# Patient Record
Sex: Male | Born: 1985 | Race: Black or African American | Hispanic: No | Marital: Single | Smoking: Never smoker
Health system: Southern US, Community
[De-identification: ages and names within clinical notes are randomized; demographics above are authoritative.]

## PROBLEM LIST (undated history)

## (undated) DIAGNOSIS — I5021 Acute systolic (congestive) heart failure: Secondary | ICD-10-CM

## (undated) DIAGNOSIS — I2510 Atherosclerotic heart disease of native coronary artery without angina pectoris: Secondary | ICD-10-CM

## (undated) HISTORY — DX: Atherosclerotic heart disease of native coronary artery without angina pectoris: I25.10

## (undated) HISTORY — DX: Acute systolic (congestive) heart failure: I50.21

---

## 2020-11-07 ENCOUNTER — Inpatient Hospital Stay (HOSPITAL_COMMUNITY)

## 2020-11-07 ENCOUNTER — Emergency Department (HOSPITAL_COMMUNITY)

## 2020-11-07 ENCOUNTER — Inpatient Hospital Stay (HOSPITAL_COMMUNITY)
Admission: EM | Admit: 2020-11-07 | Discharge: 2020-11-11 | DRG: 917 | Disposition: A | Discharge status: Home / Self Care | Attending: Internal Medicine | Admitting: Internal Medicine

## 2020-11-07 ENCOUNTER — Other Ambulatory Visit: Payer: Self-pay

## 2020-11-07 DIAGNOSIS — E785 Hyperlipidemia, unspecified: Secondary | ICD-10-CM | POA: Diagnosis present

## 2020-11-07 DIAGNOSIS — E876 Hypokalemia: Secondary | ICD-10-CM | POA: Diagnosis not present

## 2020-11-07 DIAGNOSIS — Z20822 Contact with and (suspected) exposure to covid-19: Secondary | ICD-10-CM | POA: Diagnosis present

## 2020-11-07 DIAGNOSIS — F1210 Cannabis abuse, uncomplicated: Secondary | ICD-10-CM | POA: Diagnosis present

## 2020-11-07 DIAGNOSIS — I5021 Acute systolic (congestive) heart failure: Secondary | ICD-10-CM | POA: Diagnosis present

## 2020-11-07 DIAGNOSIS — Z6841 Body Mass Index (BMI) 40.0 and over, adult: Secondary | ICD-10-CM

## 2020-11-07 DIAGNOSIS — F1590 Other stimulant use, unspecified, uncomplicated: Secondary | ICD-10-CM | POA: Diagnosis present

## 2020-11-07 DIAGNOSIS — I110 Hypertensive heart disease with heart failure: Secondary | ICD-10-CM | POA: Diagnosis present

## 2020-11-07 DIAGNOSIS — R7401 Elevation of levels of liver transaminase levels: Secondary | ICD-10-CM | POA: Diagnosis present

## 2020-11-07 DIAGNOSIS — R4182 Altered mental status, unspecified: Secondary | ICD-10-CM | POA: Diagnosis not present

## 2020-11-07 DIAGNOSIS — D649 Anemia, unspecified: Secondary | ICD-10-CM | POA: Diagnosis present

## 2020-11-07 DIAGNOSIS — I2510 Atherosclerotic heart disease of native coronary artery without angina pectoris: Secondary | ICD-10-CM | POA: Diagnosis present

## 2020-11-07 DIAGNOSIS — N179 Acute kidney failure, unspecified: Secondary | ICD-10-CM | POA: Diagnosis present

## 2020-11-07 DIAGNOSIS — G9340 Encephalopathy, unspecified: Secondary | ICD-10-CM

## 2020-11-07 DIAGNOSIS — W19XXXA Unspecified fall, initial encounter: Secondary | ICD-10-CM | POA: Diagnosis present

## 2020-11-07 DIAGNOSIS — E872 Acidosis: Secondary | ICD-10-CM | POA: Diagnosis present

## 2020-11-07 DIAGNOSIS — K7200 Acute and subacute hepatic failure without coma: Secondary | ICD-10-CM | POA: Diagnosis present

## 2020-11-07 DIAGNOSIS — I319 Disease of pericardium, unspecified: Secondary | ICD-10-CM | POA: Diagnosis not present

## 2020-11-07 DIAGNOSIS — I2102 ST elevation (STEMI) myocardial infarction involving left anterior descending coronary artery: Secondary | ICD-10-CM | POA: Diagnosis present

## 2020-11-07 DIAGNOSIS — Z888 Allergy status to other drugs, medicaments and biological substances status: Secondary | ICD-10-CM

## 2020-11-07 DIAGNOSIS — T43621A Poisoning by amphetamines, accidental (unintentional), initial encounter: Principal | ICD-10-CM | POA: Diagnosis present

## 2020-11-07 DIAGNOSIS — F1910 Other psychoactive substance abuse, uncomplicated: Secondary | ICD-10-CM | POA: Diagnosis present

## 2020-11-07 DIAGNOSIS — G928 Other toxic encephalopathy: Secondary | ICD-10-CM | POA: Diagnosis present

## 2020-11-07 DIAGNOSIS — Y929 Unspecified place or not applicable: Secondary | ICD-10-CM

## 2020-11-07 DIAGNOSIS — R9431 Abnormal electrocardiogram [ECG] [EKG]: Secondary | ICD-10-CM

## 2020-11-07 LAB — COMPREHENSIVE METABOLIC PANEL
ALT: 79 U/L — ABNORMAL HIGH (ref 0–44)
AST: 136 U/L — ABNORMAL HIGH (ref 15–41)
Albumin: 5 g/dL (ref 3.5–5.0)
Alkaline Phosphatase: 62 U/L (ref 38–126)
Anion gap: 17 — ABNORMAL HIGH (ref 5–15)
BUN: 32 mg/dL — ABNORMAL HIGH (ref 6–20)
CO2: 21 mmol/L — ABNORMAL LOW (ref 22–32)
Calcium: 10.6 mg/dL — ABNORMAL HIGH (ref 8.9–10.3)
Chloride: 101 mmol/L (ref 98–111)
Creatinine, Ser: 3.08 mg/dL — ABNORMAL HIGH (ref 0.61–1.24)
GFR, Estimated: 27 mL/min — ABNORMAL LOW (ref >60)
Glucose, Bld: 125 mg/dL — ABNORMAL HIGH (ref 70–99)
Potassium: 4.3 mmol/L (ref 3.5–5.1)
Sodium: 139 mmol/L (ref 135–145)
Total Bilirubin: 1.4 mg/dL — ABNORMAL HIGH (ref 0.3–1.2)
Total Protein: 10.3 g/dL — ABNORMAL HIGH (ref 6.5–8.1)

## 2020-11-07 LAB — I-STAT VENOUS BLOOD GAS, ED
Acid-base deficit: 5 mmol/L — ABNORMAL HIGH (ref 0.0–2.0)
Bicarbonate: 20.3 mmol/L (ref 20.0–28.0)
Calcium, Ion: 1.21 mmol/L (ref 1.15–1.40)
HCT: 45 % (ref 39.0–52.0)
Hemoglobin: 15.3 g/dL (ref 13.0–17.0)
O2 Saturation: 94 %
Potassium: 3.6 mmol/L (ref 3.5–5.1)
Sodium: 144 mmol/L (ref 135–145)
TCO2: 21 mmol/L — ABNORMAL LOW (ref 22–32)
pCO2, Ven: 39.1 mmHg — ABNORMAL LOW (ref 44.0–60.0)
pH, Ven: 7.324 (ref 7.250–7.430)
pO2, Ven: 78 mmHg — ABNORMAL HIGH (ref 32.0–45.0)

## 2020-11-07 LAB — CBC WITH DIFFERENTIAL/PLATELET
Abs Immature Granulocytes: 0.05 10*3/uL (ref 0.00–0.07)
Basophils Absolute: 0.1 10*3/uL (ref 0.0–0.1)
Basophils Relative: 1 %
Eosinophils Absolute: 0 10*3/uL (ref 0.0–0.5)
Eosinophils Relative: 0 %
HCT: 52.4 % — ABNORMAL HIGH (ref 39.0–52.0)
Hemoglobin: 17.2 g/dL — ABNORMAL HIGH (ref 13.0–17.0)
Immature Granulocytes: 1 %
Lymphocytes Relative: 19 %
Lymphs Abs: 2 10*3/uL (ref 0.7–4.0)
MCH: 27.2 pg (ref 26.0–34.0)
MCHC: 32.8 g/dL (ref 30.0–36.0)
MCV: 82.8 fL (ref 80.0–100.0)
Monocytes Absolute: 0.8 10*3/uL (ref 0.1–1.0)
Monocytes Relative: 8 %
Neutro Abs: 7.4 10*3/uL (ref 1.7–7.7)
Neutrophils Relative %: 71 %
Platelets: 362 10*3/uL (ref 150–400)
RBC: 6.33 MIL/uL — ABNORMAL HIGH (ref 4.22–5.81)
RDW: 13.6 % (ref 11.5–15.5)
WBC: 10.3 10*3/uL (ref 4.0–10.5)
nRBC: 0 % (ref 0.0–0.2)

## 2020-11-07 LAB — RAPID URINE DRUG SCREEN, HOSP PERFORMED
Amphetamines: POSITIVE — AB
Barbiturates: NOT DETECTED
Benzodiazepines: NOT DETECTED
Cocaine: NOT DETECTED
Opiates: POSITIVE — AB
Tetrahydrocannabinol: POSITIVE — AB

## 2020-11-07 LAB — TROPONIN I (HIGH SENSITIVITY)
Troponin I (High Sensitivity): 23 ng/L — ABNORMAL HIGH (ref <18)
Troponin I (High Sensitivity): 377 ng/L (ref <18)

## 2020-11-07 LAB — GLUCOSE, CAPILLARY: Glucose-Capillary: 140 mg/dL — ABNORMAL HIGH (ref 70–99)

## 2020-11-07 LAB — MRSA NEXT GEN BY PCR, NASAL: MRSA by PCR Next Gen: NOT DETECTED

## 2020-11-07 LAB — ACETAMINOPHEN LEVEL: Acetaminophen (Tylenol), Serum: <10 ug/mL — ABNORMAL LOW (ref 10–30)

## 2020-11-07 LAB — LACTIC ACID, PLASMA: Lactic Acid, Venous: 3.6 mmol/L (ref 0.5–1.9)

## 2020-11-07 LAB — RESP PANEL BY RT-PCR (FLU A&B, COVID) ARPGX2
Influenza A by PCR: NEGATIVE
Influenza B by PCR: NEGATIVE
SARS Coronavirus 2 by RT PCR: NEGATIVE

## 2020-11-07 LAB — SARS CORONAVIRUS 2 (TAT 6-24 HRS): SARS Coronavirus 2: NEGATIVE

## 2020-11-07 LAB — AMMONIA: Ammonia: 27 umol/L (ref 9–35)

## 2020-11-07 LAB — ETHANOL: Alcohol, Ethyl (B): <10 mg/dL (ref <10)

## 2020-11-07 LAB — SALICYLATE LEVEL: Salicylate Lvl: <7 mg/dL — ABNORMAL LOW (ref 7.0–30.0)

## 2020-11-07 MED ORDER — CHLORHEXIDINE GLUCONATE CLOTH 2 % EX PADS
6.0000 | MEDICATED_PAD | Freq: Every day | CUTANEOUS | Status: DC
  Administered 2020-11-07 – 2020-11-11 (×4): 6 via TOPICAL

## 2020-11-07 MED ORDER — DEXMEDETOMIDINE HCL IN NACL 400 MCG/100ML IV SOLN
0.4000 ug/kg/h | INTRAVENOUS | Status: DC
  Administered 2020-11-07: 0.4 ug/kg/h via INTRAVENOUS
  Filled 2020-11-07: qty 100

## 2020-11-07 MED ORDER — HEPARIN (PORCINE) IN NACL 1000-0.9 UT/500ML-% IV SOLN
INTRAVENOUS | Status: AC
  Filled 2020-11-07: qty 1000

## 2020-11-07 MED ORDER — LORAZEPAM 2 MG/ML IJ SOLN: 0.5000 mg | INTRAMUSCULAR | Status: DC | PRN

## 2020-11-07 MED ORDER — ASPIRIN 300 MG RE SUPP
300.0000 mg | Freq: Once | RECTAL | Status: AC
  Administered 2020-11-07: 300 mg via RECTAL
  Filled 2020-11-07: qty 1

## 2020-11-07 MED ORDER — FOLIC ACID 5 MG/ML IJ SOLN
1.0000 mg | Freq: Every day | INTRAMUSCULAR | Status: DC
  Administered 2020-11-07 – 2020-11-08 (×2): 1 mg via INTRAVENOUS
  Filled 2020-11-07 (×2): qty 0.2

## 2020-11-07 MED ORDER — MORPHINE SULFATE (PF) 4 MG/ML IV SOLN: 4.0000 mg | Freq: Once | INTRAVENOUS | Status: AC

## 2020-11-07 MED ORDER — LORAZEPAM 2 MG/ML IJ SOLN
1.0000 mg | Freq: Once | INTRAMUSCULAR | Status: AC
  Administered 2020-11-07: 1 mg via INTRAVENOUS
  Filled 2020-11-07: qty 1

## 2020-11-07 MED ORDER — LACTATED RINGERS IV SOLN: INTRAVENOUS | Status: DC

## 2020-11-07 MED ORDER — DEXMEDETOMIDINE HCL IN NACL 400 MCG/100ML IV SOLN
0.4000 ug/kg/h | INTRAVENOUS | Status: DC
  Administered 2020-11-07 (×2): 0.6 ug/kg/h via INTRAVENOUS
  Administered 2020-11-08: 0.4 ug/kg/h via INTRAVENOUS
  Administered 2020-11-08: 0.6 ug/kg/h via INTRAVENOUS
  Filled 2020-11-07 (×5): qty 100

## 2020-11-07 MED ORDER — PANTOPRAZOLE SODIUM 40 MG IV SOLR
40.0000 mg | Freq: Every day | INTRAVENOUS | Status: DC
  Administered 2020-11-07: 40 mg via INTRAVENOUS
  Filled 2020-11-07: qty 40

## 2020-11-07 MED ORDER — LACTATED RINGERS IV BOLUS
1000.0000 mL | Freq: Once | INTRAVENOUS | Status: AC
  Administered 2020-11-07: 1000 mL via INTRAVENOUS

## 2020-11-07 MED ORDER — DOCUSATE SODIUM 100 MG PO CAPS: 100.0000 mg | ORAL_CAPSULE | Freq: Two times a day (BID) | ORAL | Status: DC | PRN

## 2020-11-07 MED ORDER — POLYETHYLENE GLYCOL 3350 17 G PO PACK: 17.0000 g | PACK | Freq: Every day | ORAL | Status: DC | PRN

## 2020-11-07 MED ORDER — LIDOCAINE HCL (PF) 1 % IJ SOLN
INTRAMUSCULAR | Status: AC
  Filled 2020-11-07: qty 30

## 2020-11-07 MED ORDER — MORPHINE SULFATE (PF) 4 MG/ML IV SOLN
INTRAVENOUS | Status: AC
  Administered 2020-11-07: 4 mg via INTRAVENOUS
  Filled 2020-11-07: qty 1

## 2020-11-07 MED ORDER — THIAMINE HCL 100 MG/ML IJ SOLN
100.0000 mg | Freq: Every day | INTRAMUSCULAR | Status: DC
  Administered 2020-11-08 – 2020-11-09 (×2): 100 mg via INTRAVENOUS
  Filled 2020-11-07 (×2): qty 2

## 2020-11-07 NOTE — Progress Notes (Signed)
EEG Completed; Results Pending  

## 2020-11-07 NOTE — H&P (Signed)
NAME:  Ricky Berg, MRN:  [id number], DOB:  12/25/1987, LOS: 0 ADMISSION DATE:  11/07/2020, CONSULTATION DATE: 11/08/2018. REFERRING MD: Emergency department physician, CHIEF COMPLAINT: Altered mental status along with suspected overdose positive cardiac enzymes along with renal insufficiency and lactic  History of Present Illness:  35 year old male who is being brought into the local jail house and while being in process Eula Flax was seen doing just some chemicals.  His UDS was positive for amphetamines opiates and marijuana.  Planes of chest pain his troponin was 29 this was evaluated by cardiology most likely secondary to vasospasm no intervention is planned at this time.  Pulmonary critical care called to the bedside due to his agitation is positive cardiac enzymes lactic acidosis and renal insufficiency.  He will be admitted to the intensive care unit weanable placed on thiamine and folic acid along with Precedex and Ativan for agitation  Pertinent  Medical History  No past medical history on file.   Significant Hospital Events: Including procedures, antibiotic start and stop dates in addition to other pertinent events   11/08/2019 admit to ICU  Interim History / Subjective:  35 year old remains agitated  Objective   Blood pressure (!) 141/97, pulse 94, temperature 98.3 F (36.8 C), resp. rate (!) 24, SpO2 95 %.    FiO2 (%):  [28 %] 28 %   Intake/Output Summary (Last 24 hours) at 11/07/2020 1221 Last data filed at 11/07/2020 1220 Gross per 24 hour  Intake 2000 ml  Output --  Net 2000 ml   There were no vitals filed for this visit.  Examination: General: Morbidly obese male.  Seated sedated or agitated HENT: No JVD or lymphadenopathy is appreciated Lungs: Clear to auscultation Cardiovascular: Heart sounds regular regular rhythm Abdomen: Obese soft nontender Extremities: Warm and dry Neuro: Either agitated or sedated GU: Voids  Resolved Hospital Problem list      Assessment & Plan:  Altered mental status most likely due to ingestion of drugs while in jail.  UDS positive for tetrahydrocannabinol, opiates and amphetamines. Chemical restraint Physical restraint Consider adding Precedex As needed Ativan Monitoring intensive care unit If no improvement may require CT of the head  Elevated troponins 29 with EKG changes compatible with acute coronary syndrome but no interventions at this time is most likely secondary to vasospasm from amphetamine. Cardiology has evaluated Cardiac monitor. Serial troponin Repeat twelve-lead When Amphetamines have dissipated  Lactic acidosis Repeat lactic acid Trend lactic acid Fluid resuscitation  Acute kidney injury Lab Results  Component Value Date   CREATININE 3.08 (H) 11/07/2020   Trend creatinine Fluid resuscitation  Best Practice (right click and "Reselect all SmartList Selections" daily)   Diet/type: NPO DVT prophylaxis: not indicated GI prophylaxis: N/A Lines: N/A Foley:  N/A Code Status:  full code Last date of multidisciplinary goals of care discussion [tbd]  Labs   CBC: Recent Labs  Lab 11/07/20 0903 11/07/20 1128  WBC 10.3  --   NEUTROABS 7.4  --   HGB 17.2* 15.3  HCT 52.4* 45.0  MCV 82.8  --   PLT 362  --     Basic Metabolic Panel: Recent Labs  Lab 11/07/20 0903 11/07/20 1128  NA 139 144  K 4.3 3.6  CL 101  --   CO2 21*  --   GLUCOSE 125*  --   BUN 32*  --   CREATININE 3.08*  --   CALCIUM 10.6*  --    GFR: CrCl cannot be calculated (Unknown ideal weight.). Recent Labs  Lab 11/07/20 0903 11/07/20 0923  WBC 10.3  --   LATICACIDVEN  --  3.6*    Liver Function Tests: Recent Labs  Lab 11/07/20 0903  AST 136*  ALT 79*  ALKPHOS 62  BILITOT 1.4*  PROT 10.3*  ALBUMIN 5.0   No results for input(s): LIPASE, AMYLASE in the last 168 hours. Recent Labs  Lab 11/07/20 0924  AMMONIA 27    ABG    Component Value Date/Time   HCO3 20.3 11/07/2020 1128    TCO2 21 (L) 11/07/2020 1128   ACIDBASEDEF 5.0 (H) 11/07/2020 1128   O2SAT 94.0 11/07/2020 1128     Coagulation Profile: No results for input(s): INR, PROTIME in the last 168 hours.  Cardiac Enzymes: No results for input(s): CKTOTAL, CKMB, CKMBINDEX, TROPONINI in the last 168 hours.  HbA1C: No results found for: HGBA1C  CBG: No results for input(s): GLUCAP in the last 168 hours.  Review of Systems:   na  Past Medical History:  He,  has no past medical history on file.   Surgical History:     Social History:      Family History:  His family history is not on file.   Allergies Not on File   Home Medications  Prior to Admission medications   Not on File     Critical care time: 40 min    Brett Canales Fonnie Crookshanks ACNP Acute Care Nurse Practitioner [facility] Pulmonary/Critical Care Please consult Amion 11/07/2020, 12:21 PM

## 2020-11-07 NOTE — Progress Notes (Signed)
Vonzella Nipple called regarding bringing patients glasses from facility with oncoming deputy.

## 2020-11-07 NOTE — ED Triage Notes (Signed)
Patient brought in by [facility] from prison for drug overdose. Patient was lethargic, altered and possibly took stimulants. Patient in handcuffs, yelling and cussing at staff for water. "I will shoot you if you do not give me water." Patient restless and diaphoretic. C/o chest pain.

## 2020-11-07 NOTE — Progress Notes (Signed)
eLink Physician-Brief Progress Note Patient Name: Ricky Berg DOB: 12/25/1987 MRN: [id number]   Date of Service  11/07/2020  HPI/Events of Note  Patient is requesting a drink of water, he is alert, oriented and interactive.  eICU Interventions  Bedside swallow evaluation ordered, followed by a clear liquid diet if he passes.        Migdalia Dk 11/07/2020, 9:31 PM

## 2020-11-07 NOTE — ED Notes (Signed)
Attempted report 

## 2020-11-07 NOTE — ED Notes (Signed)
Spoke with charge RN on 55M regarding patient. Still waiting to give report.

## 2020-11-07 NOTE — Progress Notes (Signed)
Pt unavailable for EEG at this time as will be going to 36M; will do EEG later as schedule permits.

## 2020-11-07 NOTE — Progress Notes (Signed)
eLink Physician-Brief Progress Note Patient Name: Ricky Berg DOB: 12/25/1987 MRN: [id number]   Date of Service  11/07/2020  HPI/Events of Note  Patient is a difficult iv stick and has not had blood successfully drawn for admission labs.  eICU Interventions  Midline iv ordered,  17M charge nurse requested to attempt to obtain blood for labs.        Migdalia Dk 11/07/2020, 10:41 PM

## 2020-11-07 NOTE — ED Notes (Signed)
Attempted report x1. 

## 2020-11-07 NOTE — ED Provider Notes (Addendum)
[facility]ENC[facility] Provider Note   CSN: [id number] Arrival date & time: 11/07/20  0847     History No chief complaint on file.   Ricky Berg is a 35 y.o. male.  Patient is a 35 year old male with unknown medical history who is presenting today from jail initially as a code STEMI.  The story is provided by paramedics and the police officer and report that the patient took an unknown substance prior to be taking into police custody.  While he was in the holding cell of the jail he was extremely aggressive pounding on the door is beating on the windows and then suddenly became unresponsive, sweaty and ashen.  Police called paramedics and when they arrived patient was minimally responsive with significant diaphoresis and an EKG showed significant ST elevation in multiple leads as well as tachycardia and hypertension.  Patient refused aspirin and nitroglycerin in route and upon arrival to the emergency room paramedics do report that he is much more interactive at this time.  Patient refuses to answer most questions.  He repeatedly requests water.  When asked if he has chest pain he does say yes and touches his chest but does not give any further information.  He cannot tell me that we are in the hospital.  Unknown if he has any medical problems or takes any medications.  Unknown what he took prior to arrival.  Patient just continues to say that Gregary SignsSean wants to die outside and we are depriving him of his last wish.  The history is provided by the patient, the EMS personnel and the police. The history is limited by the condition of the patient.      No past medical history on file.  There are no problems to display for this patient.        No family history on file.     Home Medications Prior to Admission medications   Not on File    Allergies    Patient has no allergy information on record.  Review of Systems   Review of Systems  Unable to perform  ROS: Mental status change   Physical Exam Updated Vital Signs BP (!) 128/100 (BP Location: Right Arm)   Pulse (!) 136   Temp 98.3 F (36.8 C)   Resp (!) 24   SpO2 93%   Physical Exam Vitals and nursing note reviewed.  Constitutional:      General: He is not in acute distress.    Appearance: He is well-developed. He is obese. He is ill-appearing and diaphoretic.  HENT:     Head: Normocephalic and atraumatic.     Mouth/Throat:     Mouth: Mucous membranes are dry.  Eyes:     Extraocular Movements: Extraocular movements intact.     Conjunctiva/sclera: Conjunctivae normal.     Pupils: Pupils are equal, round, and reactive to light.  Cardiovascular:     Rate and Rhythm: Regular rhythm. Tachycardia present.     Pulses: Normal pulses.     Heart sounds: No murmur heard. Pulmonary:     Effort: Pulmonary effort is normal. No respiratory distress.     Breath sounds: Normal breath sounds. No wheezing or rales.  Abdominal:     General: There is no distension.     Palpations: Abdomen is soft.     Tenderness: There is no abdominal tenderness. There is no guarding or rebound.  Musculoskeletal:        General: No tenderness. Normal range of  motion.     Cervical back: Normal range of motion and neck supple.     Right lower leg: No edema.     Left lower leg: No edema.  Skin:    General: Skin is warm.     Coloration: Skin is pale.     Findings: No erythema or rash.  Neurological:     Mental Status: He is alert.     Comments: Oriented to place.  Noted to move all extremities.  Will not follow commands.  Psychiatric:        Attention and Perception: He is inattentive.        Mood and Affect: Affect is inappropriate.        Speech: He is noncommunicative.        Behavior: Behavior is agitated.     Comments: Appears to be speaking in third person reporting that Gregary Signs wants to die outside.    ED Results / Procedures / Treatments   Labs (all labs ordered are listed, but only abnormal  results are displayed) Labs Reviewed  CBC WITH DIFFERENTIAL/PLATELET - Abnormal; Notable for the following components:      Result Value   RBC 6.33 (*)    Hemoglobin 17.2 (*)    HCT 52.4 (*)    All other components within normal limits  COMPREHENSIVE METABOLIC PANEL - Abnormal; Notable for the following components:   Glucose, Bld 125 (*)    BUN 32 (*)    Creatinine, Ser 3.08 (*)    Calcium 10.6 (*)    Total Protein 10.3 (*)    AST 136 (*)    ALT 79 (*)    Total Bilirubin 1.4 (*)    GFR, Estimated 27 (*)    All other components within normal limits  LACTIC ACID, PLASMA - Abnormal; Notable for the following components:   Lactic Acid, Venous 3.6 (*)    All other components within normal limits  TROPONIN I (HIGH SENSITIVITY) - Abnormal; Notable for the following components:   Troponin I (High Sensitivity) 23 (*)    All other components within normal limits  RESP PANEL BY RT-PCR (FLU A&B, COVID) ARPGX2  AMMONIA  ETHANOL  RAPID URINE DRUG SCREEN, HOSP PERFORMED  ACETAMINOPHEN LEVEL  SALICYLATE LEVEL    EKG EKG Interpretation  Date/Time:  Tuesday November 07 2020 08:59:44 EDT Ventricular Rate:  144 PR Interval:  102 QRS Duration: 74 QT Interval:  288 QTC Calculation: 446 R Axis:   48 Text Interpretation: Sinus tachycardia Probable inferior infarct, acute Anterolateral infarct, acute (LAD) Baseline wander in lead(s) V1 Acute MI Confirmed by Gwyneth Sprout ([id number]) on 11/07/2020 9:43:31 AM  Radiology DG Chest Port 1 View  Result Date: 11/07/2020 CLINICAL DATA:  Chest pain and drug overdose EXAM: PORTABLE CHEST 1 VIEW COMPARISON:  None. FINDINGS: A small portion of the lateral left base is not visualized. Visualized lungs are clear. Heart is upper normal in size with pulmonary vascularity normal. No adenopathy. No pneumothorax. No bone lesions. IMPRESSION: No edema or airspace opacity. Note that a small portion of the lateral left base not visualized. Heart upper normal in size. No  adenopathy evident. Electronically Signed   By: Bretta Bang III M.D.   On: 11/07/2020 09:32    Procedures Procedures   Medications Ordered in ED Medications - No data to display  ED Course  I have reviewed the triage vital signs and the nursing notes.  Pertinent labs & imaging results that were available during my care of  the patient were reviewed by me and considered in my medical decision making (see chart for details).    MDM Rules/Calculators/A&P                          35 year old male with unknown medical history presenting from jail today initially as a code STEMI.  Patient took an unknown substance which is thought to be a stimulant because he is tachycardic and hypertensive but has an EKG with diffuse ST elevation in lateral anterior and some inferior leads.  Patient does indicate that he has chest pain but will not give any further history.  Dr. Eldridge Dace was present at the bridge when the patient arrived and due to his altered mental status, report of unknown substance ingestion and diffuse ST elevation he was concerned that this is more from diffuse coronary spasm from drugs and they chose not to take the patient to the Cath Lab.  Based on EMSs report he is more lucid now than he had been earlier.  Patient is just recurrently requesting water and may be speaking about himself in the third person reporting that he wants to die.  Patient is still diaphoretic and tachycardic.  Will give IV Ativan, IV fluids.  Labs are pending.  We will continue to monitor closely.  At this time he is cooperative.  10:19 AM Patient remained agitated, singing, tongue fasciculations and encephalopathic.  Continued to have tachycardia and moaning that his chest hurt.  Patient was given 4 mg of morphine and another additional 1 mg of Ativan.  Heart rate did improve after these medications and receiving 1 L of fluid to approximately 120.  Ammonia within normal limits, lactic acid is 3.6, CBC normal  except for hemoglobin of 17, CMP with a creatinine of 3, BUN of 32 and elevated LFTs AST of 136 and ALT of 79.  Troponin elevated at 23 COVID is negative.  Chest x-ray showing no evidence of edema or airspace opacity.  10:39 AM Patient is now more somnolent.  He does arouse with some stimulation but is still altered.  Heart rate now 114, oxygen had dropped to 90% and he was placed on 2 L.  Will check VBG.  Will discuss with ICU.  MDM   Amount and/or Complexity of Data Reviewed Clinical lab tests: ordered and reviewed Tests in the radiology section of CPT: ordered and reviewed Tests in the medicine section of CPT: ordered and reviewed Decide to obtain previous medical records or to obtain history from someone other than the patient: yes Obtain history from someone other than the patient: yes Review and summarize past medical records: yes Discuss the patient with other providers: yes Independent visualization of images, tracings, or specimens: yes  Patient Progress Patient progress: improved  CRITICAL CARE Performed by: Kelten Enochs Total critical care time: 60 minutes Critical care time was exclusive of separately billable procedures and treating other patients. Critical care was necessary to treat or prevent imminent or life-threatening deterioration. Critical care was time spent personally by me on the following activities: development of treatment plan with patient and/or surrogate as well as nursing, discussions with consultants, evaluation of patient's response to treatment, examination of patient, obtaining history from patient or surrogate, ordering and performing treatments and interventions, ordering and review of laboratory studies, ordering and review of radiographic studies, pulse oximetry and re-evaluation of patient's condition.  Final Clinical Impression(s) / ED Diagnoses Final diagnoses:  ST elevation  AKI (acute kidney  injury) (HCC)  Encephalopathy  Substance  abuse [facility])    Rx / DC Orders ED Discharge Orders     None        Gwyneth Sprout, MD 11/07/20 1112    Gwyneth Sprout, MD 11/07/20 1113

## 2020-11-07 NOTE — Significant Event (Signed)
    Called for STEMI.  ECG showed diffuse ST elevation.  In the ER, it was noted that the patient had taken an unknown stimulant and was confused.  He had been fighting with people at the jail this AM.  ECG findings likely related to illicit drug use/vasospasm and we will defer cath for now.    Corky Crafts, MD

## 2020-11-08 ENCOUNTER — Inpatient Hospital Stay: Payer: Self-pay

## 2020-11-08 ENCOUNTER — Inpatient Hospital Stay (HOSPITAL_COMMUNITY)

## 2020-11-08 ENCOUNTER — Encounter (HOSPITAL_COMMUNITY): Payer: Self-pay | Admitting: Internal Medicine

## 2020-11-08 DIAGNOSIS — I2109 ST elevation (STEMI) myocardial infarction involving other coronary artery of anterior wall: Secondary | ICD-10-CM

## 2020-11-08 DIAGNOSIS — R4182 Altered mental status, unspecified: Secondary | ICD-10-CM

## 2020-11-08 DIAGNOSIS — I2102 ST elevation (STEMI) myocardial infarction involving left anterior descending coronary artery: Secondary | ICD-10-CM

## 2020-11-08 LAB — COMPREHENSIVE METABOLIC PANEL
ALT: 61 U/L — ABNORMAL HIGH (ref 0–44)
ALT: 80 U/L — ABNORMAL HIGH (ref 0–44)
AST: 189 U/L — ABNORMAL HIGH (ref 15–41)
AST: 236 U/L — ABNORMAL HIGH (ref 15–41)
Albumin: 3.3 g/dL — ABNORMAL LOW (ref 3.5–5.0)
Albumin: 3.6 g/dL (ref 3.5–5.0)
Alkaline Phosphatase: 38 U/L (ref 38–126)
Alkaline Phosphatase: 50 U/L (ref 38–126)
Anion gap: 10 (ref 5–15)
Anion gap: 8 (ref 5–15)
BUN: 18 mg/dL (ref 6–20)
BUN: 29 mg/dL — ABNORMAL HIGH (ref 6–20)
CO2: 23 mmol/L (ref 22–32)
CO2: 24 mmol/L (ref 22–32)
Calcium: 8.4 mg/dL — ABNORMAL LOW (ref 8.9–10.3)
Calcium: 8.7 mg/dL — ABNORMAL LOW (ref 8.9–10.3)
Chloride: 107 mmol/L (ref 98–111)
Chloride: 99 mmol/L (ref 98–111)
Creatinine, Ser: 1.07 mg/dL (ref 0.61–1.24)
Creatinine, Ser: 1.57 mg/dL — ABNORMAL HIGH (ref 0.61–1.24)
GFR, Estimated: 60 mL/min — ABNORMAL LOW (ref >60)
GFR, Estimated: >60 mL/min (ref >60)
Glucose, Bld: 143 mg/dL — ABNORMAL HIGH (ref 70–99)
Glucose, Bld: 72 mg/dL (ref 70–99)
Potassium: 3.7 mmol/L (ref 3.5–5.1)
Potassium: 3.7 mmol/L (ref 3.5–5.1)
Sodium: 133 mmol/L — ABNORMAL LOW (ref 135–145)
Sodium: 138 mmol/L (ref 135–145)
Total Bilirubin: 0.6 mg/dL (ref 0.3–1.2)
Total Bilirubin: 0.9 mg/dL (ref 0.3–1.2)
Total Protein: 6.8 g/dL (ref 6.5–8.1)
Total Protein: 7.5 g/dL (ref 6.5–8.1)

## 2020-11-08 LAB — TROPONIN I (HIGH SENSITIVITY)
Troponin I (High Sensitivity): 13566 ng/L (ref <18)
Troponin I (High Sensitivity): 20555 ng/L (ref <18)
Troponin I (High Sensitivity): 22828 ng/L (ref <18)

## 2020-11-08 LAB — LACTIC ACID, PLASMA
Lactic Acid, Venous: 3 mmol/L (ref 0.5–1.9)
Lactic Acid, Venous: 4 mmol/L (ref 0.5–1.9)

## 2020-11-08 LAB — PHOSPHORUS
Phosphorus: 2.4 mg/dL — ABNORMAL LOW (ref 2.5–4.6)
Phosphorus: 3.5 mg/dL (ref 2.5–4.6)

## 2020-11-08 LAB — CBC
HCT: 27.6 % — ABNORMAL LOW (ref 39.0–52.0)
HCT: 41.3 % (ref 39.0–52.0)
Hemoglobin: 13.2 g/dL (ref 13.0–17.0)
Hemoglobin: 8.6 g/dL — ABNORMAL LOW (ref 13.0–17.0)
MCH: 26.7 pg (ref 26.0–34.0)
MCH: 27.1 pg (ref 26.0–34.0)
MCHC: 31.2 g/dL (ref 30.0–36.0)
MCHC: 32 g/dL (ref 30.0–36.0)
MCV: 83.4 fL (ref 80.0–100.0)
MCV: 87.1 fL (ref 80.0–100.0)
Platelets: 206 10*3/uL (ref 150–400)
Platelets: 333 10*3/uL (ref 150–400)
RBC: 3.17 MIL/uL — ABNORMAL LOW (ref 4.22–5.81)
RBC: 4.95 MIL/uL (ref 4.22–5.81)
RDW: 13.3 % (ref 11.5–15.5)
RDW: 13.9 % (ref 11.5–15.5)
WBC: 5.8 10*3/uL (ref 4.0–10.5)
WBC: 8.6 10*3/uL (ref 4.0–10.5)
nRBC: 0 % (ref 0.0–0.2)
nRBC: 0 % (ref 0.0–0.2)

## 2020-11-08 LAB — ECHOCARDIOGRAM COMPLETE
AR max vel: 3.81 cm2
AV Area VTI: 3.64 cm2
AV Area mean vel: 3.92 cm2
AV Mean grad: 2 mmHg
AV Peak grad: 3.3 mmHg
Ao pk vel: 0.91 m/s
Area-P 1/2: 4.46 cm2
Height: 76 in
S' Lateral: 3 cm
Weight: 5513.26 oz

## 2020-11-08 LAB — MAGNESIUM
Magnesium: 2 mg/dL (ref 1.7–2.4)
Magnesium: 2.2 mg/dL (ref 1.7–2.4)

## 2020-11-08 LAB — AMYLASE
Amylase: 22 U/L — ABNORMAL LOW (ref 28–100)
Amylase: 29 U/L (ref 28–100)

## 2020-11-08 LAB — LIPASE, BLOOD
Lipase: 25 U/L (ref 11–51)
Lipase: 34 U/L (ref 11–51)

## 2020-11-08 LAB — PROCALCITONIN
Procalcitonin: 0.16 ng/mL
Procalcitonin: 0.12 ng/mL

## 2020-11-08 LAB — HIV ANTIBODY (ROUTINE TESTING W REFLEX)
HIV Screen 4th Generation wRfx: NONREACTIVE
HIV Screen 4th Generation wRfx: NONREACTIVE

## 2020-11-08 LAB — CORTISOL
Cortisol, Plasma: 13.8 ug/dL
Cortisol, Plasma: 20.8 ug/dL

## 2020-11-08 LAB — APTT
aPTT: 24 seconds (ref 24–36)
aPTT: 40 seconds — ABNORMAL HIGH (ref 24–36)

## 2020-11-08 LAB — HEPARIN LEVEL (UNFRACTIONATED): Heparin Unfractionated: 0.25 IU/mL — ABNORMAL LOW (ref 0.30–0.70)

## 2020-11-08 LAB — PROTIME-INR
INR: 1.1 (ref 0.8–1.2)
INR: 1.2 (ref 0.8–1.2)
Prothrombin Time: 13.9 seconds (ref 11.4–15.2)
Prothrombin Time: 14.8 seconds (ref 11.4–15.2)

## 2020-11-08 MED ORDER — POTASSIUM CHLORIDE CRYS ER 20 MEQ PO TBCR
20.0000 meq | EXTENDED_RELEASE_TABLET | Freq: Once | ORAL | Status: AC
  Administered 2020-11-08: 20 meq via ORAL
  Filled 2020-11-08: qty 1

## 2020-11-08 MED ORDER — METOPROLOL TARTRATE 25 MG PO TABS
25.0000 mg | ORAL_TABLET | Freq: Two times a day (BID) | ORAL | Status: DC
  Administered 2020-11-08: 25 mg via ORAL
  Filled 2020-11-08: qty 1

## 2020-11-08 MED ORDER — HEPARIN (PORCINE) 25000 UT/250ML-% IV SOLN
2150.0000 [IU]/h | INTRAVENOUS | Status: DC
  Administered 2020-11-08 (×2): 1900 [IU]/h via INTRAVENOUS
  Administered 2020-11-09: 2150 [IU]/h via INTRAVENOUS
  Filled 2020-11-08 (×3): qty 250

## 2020-11-08 MED ORDER — PERFLUTREN LIPID MICROSPHERE
1.0000 mL | INTRAVENOUS | Status: AC | PRN
  Administered 2020-11-08: 5 mL via INTRAVENOUS
  Filled 2020-11-08: qty 10

## 2020-11-08 MED ORDER — NITROGLYCERIN 0.4 MG SL SUBL
0.4000 mg | SUBLINGUAL_TABLET | SUBLINGUAL | Status: DC | PRN
  Administered 2020-11-08 – 2020-11-11 (×7): 0.4 mg via SUBLINGUAL
  Filled 2020-11-08 (×4): qty 1

## 2020-11-08 MED ORDER — ASPIRIN 325 MG PO TABS
325.0000 mg | ORAL_TABLET | Freq: Every day | ORAL | Status: DC
  Administered 2020-11-08: 325 mg via ORAL
  Filled 2020-11-08 (×2): qty 1

## 2020-11-08 MED ORDER — CARVEDILOL 3.125 MG PO TABS
3.1250 mg | ORAL_TABLET | Freq: Two times a day (BID) | ORAL | Status: DC
  Administered 2020-11-09 – 2020-11-10 (×3): 3.125 mg via ORAL
  Filled 2020-11-08 (×4): qty 1

## 2020-11-08 MED ORDER — FOLIC ACID 1 MG PO TABS
1.0000 mg | ORAL_TABLET | Freq: Every day | ORAL | Status: DC
  Administered 2020-11-09 – 2020-11-11 (×3): 1 mg via ORAL
  Filled 2020-11-08 (×3): qty 1

## 2020-11-08 MED ORDER — ATORVASTATIN CALCIUM 80 MG PO TABS
80.0000 mg | ORAL_TABLET | Freq: Every day | ORAL | Status: DC
  Administered 2020-11-08 – 2020-11-11 (×4): 80 mg via ORAL
  Filled 2020-11-08 (×2): qty 2
  Filled 2020-11-08 (×2): qty 1
  Filled 2020-11-08: qty 2

## 2020-11-08 MED ORDER — MORPHINE SULFATE (PF) 2 MG/ML IV SOLN
2.0000 mg | INTRAVENOUS | Status: DC | PRN
  Administered 2020-11-08 – 2020-11-09 (×5): 2 mg via INTRAVENOUS
  Filled 2020-11-08 (×5): qty 1

## 2020-11-08 MED ORDER — HEPARIN BOLUS VIA INFUSION
5000.0000 [IU] | Freq: Once | INTRAVENOUS | Status: AC
  Administered 2020-11-08: 5000 [IU] via INTRAVENOUS
  Filled 2020-11-08: qty 5000

## 2020-11-08 MED ORDER — POTASSIUM PHOSPHATES 15 MMOLE/5ML IV SOLN
15.0000 mmol | Freq: Once | INTRAVENOUS | Status: AC
  Administered 2020-11-08: 15 mmol via INTRAVENOUS
  Filled 2020-11-08: qty 5

## 2020-11-08 MED ORDER — PANTOPRAZOLE SODIUM 40 MG PO TBEC
40.0000 mg | DELAYED_RELEASE_TABLET | Freq: Every day | ORAL | Status: DC
  Administered 2020-11-08: 40 mg via ORAL
  Filled 2020-11-08: qty 1

## 2020-11-08 MED ORDER — ASPIRIN EC 81 MG PO TBEC
81.0000 mg | DELAYED_RELEASE_TABLET | Freq: Every day | ORAL | Status: DC
  Administered 2020-11-09 – 2020-11-11 (×3): 81 mg via ORAL
  Filled 2020-11-08 (×3): qty 1

## 2020-11-08 NOTE — Progress Notes (Signed)
ANTICOAGULATION CONSULT NOTE  Pharmacy Consult fo Heparin Indication: chest pain/ACS  Allergies  Allergen Reactions   Haloperidol And Related Anaphylaxis    Face, tongue, and throat swelling    Patient Measurements: Height: 6\' 4"  (193 cm) Weight: (!) 156.3 kg (344 lb 9.3 oz) IBW/kg (Calculated) : 86.8 Heparin Dosing Weight: 120 kg  Vital Signs: Temp: 98.6 F (37 C) (06/29 1926) Temp Source: Oral (06/29 1926) BP: 118/54 (06/29 1800) Pulse Rate: 92 (06/29 1800)  Labs: Recent Labs    11/07/20 0903 11/07/20 1128 11/07/20 1144 11/07/20 2332 11/08/20 0536 11/08/20 1854  HGB 17.2* 15.3  --  8.6*  --   --   HCT 52.4* 45.0  --  27.6*  --   --   PLT 362  --   --  333  --   --   APTT  --   --   --  24  --   --   LABPROT  --   --   --  14.8  --   --   INR  --   --   --  1.2  --   --   HEPARINUNFRC  --   --   --   --   --  0.25*  CREATININE 3.08*  --   --  1.57*  --   --   TROPONINIHS 23*  --  377* 13,566* 20,555*  --      Estimated Creatinine Clearance: 109.5 mL/min (A) (by C-G formula based on SCr of 1.57 mg/dL (H)).   Assessment: 35 y.o. male admitted with chest pain and elevated troponin.  Pharmacy consulted to dose IV heparin.  Heparin level is slightly below goal.  No issue with heparin infusion nor bleeding per RN.  Goal of Therapy:  Heparin level 0.3-0.7 units/ml Monitor platelets by anticoagulation protocol: Yes   Plan:  Increase heparin gtt to 2150 units/hr F/u AM labs Awaiting PICC  Nox Talent D. [id number], PharmD, BCPS, BCCCP 11/08/2020, 7:42 PM

## 2020-11-08 NOTE — Progress Notes (Signed)
Pt c/o active chest pain radiating across chest.   Will provide nitro SL, already on heparin.   Add morphine if nitro ineffective.   Cards notified via RN although they have already seen and are planning for cath when able to get consent

## 2020-11-08 NOTE — Progress Notes (Signed)
Late entry: Patient states that his birth year is [birth year] and Not 1989. Admissions advised that they are unable to change it without proper identification.

## 2020-11-08 NOTE — Progress Notes (Signed)
Midline order dc'd, IV team able to establish access and labs were able to be drawn. Will reassess need tomorrow

## 2020-11-08 NOTE — Progress Notes (Signed)
eLink Physician-Brief Progress Note Patient Name: Ricky Berg DOB: 12/25/1987 MRN: [id number]   Date of Service  11/08/2020  HPI/Events of Note  Troponin  13, 566.  eICU Interventions  Heparin gtt started, Lipitor 80 mg po daily, ASA 325 mg po daily, Metoprolol 25 mg po bid, cardiology fellow updated ( I spoke with Dr. Aniceto Boss who indicated that she would contact Dr. Eldridge Dace who saw the patient earlier in the ED.)        Migdalia Dk 11/08/2020, 4:46 AM

## 2020-11-08 NOTE — Progress Notes (Signed)
EKG reading acute MI/STEMI, notified eLink.

## 2020-11-08 NOTE — Progress Notes (Signed)
  Echocardiogram 2D Echocardiogram has been performed.  Ricky Berg 11/08/2020, 8:41 AM

## 2020-11-08 NOTE — Progress Notes (Signed)
ANTICOAGULATION CONSULT NOTE - Initial Consult  Pharmacy Consult fo Heparin Indication: chest pain/ACS  Not on File  Patient Measurements: Height: 6\' 4"  (193 cm) Weight: (!) 156.3 kg (344 lb 9.3 oz) IBW/kg (Calculated) : 86.8 Heparin Dosing Weight: 120   Vital Signs: Temp: 96.6 F (35.9 C) (06/29 0330) Temp Source: Axillary (06/29 0330) BP: 125/63 (06/29 0500) Pulse Rate: 65 (06/29 0400)  Labs: Recent Labs    11/07/20 0903 11/07/20 1128 11/07/20 1144 11/07/20 2332  HGB 17.2* 15.3  --  8.6*  HCT 52.4* 45.0  --  27.6*  PLT 362  --   --  333  APTT  --   --   --  24  LABPROT  --   --   --  14.8  INR  --   --   --  1.2  CREATININE 3.08*  --   --  1.57*  TROPONINIHS 23*  --  377* 13,566*    Estimated Creatinine Clearance: 109.5 mL/min (A) (by C-G formula based on SCr of 1.57 mg/dL (H)).   Medical History: No past medical history on file.  Medications:  No medications prior to admission.    Assessment: 35 y.o. male admitted with chest pain and elevated troponin for heparin Goal of Therapy:  Heparin level 0.3-0.7 units/ml Monitor platelets by anticoagulation protocol: Yes   Plan:  Heparin 5000 units IV bolus, then start heparin 1900 units/hr Check heparin level in 6 hours.   34 11/08/2020,6:00 AM

## 2020-11-08 NOTE — Consult Note (Addendum)
Cardiology Consultation:   Patient ID: Ricky Berg MRN: [id number]; DOB: 12/25/1987  Admit date: 11/07/2020 Date of Consult: 11/08/2020  PCP:  Oneita Hurt, No   [facility] Providers Cardiologist:  None        Patient Profile:   Ricky Berg is a 35 y.o. male with no significant past medical historywho is being seen 11/08/2020 for the evaluation of myocardial infarction at the request of Dr. Katrinka Blazing.  History of Present Illness:   Mr. Smallman has no significant past medical history.  He was arrested yesterday and while at the jail developed chest pain.  He was seen to ingest amphetamines and is known to use methamphetamine, also testing positive for marijuana and opiates.  His initial EKG showed diffuse ST segment elevation, but because of his encephalopathy, acute kidney injury, and unknown polysubstance use, he was not felt to be a candidate for cardiac catheterization.  Vasospasm was suspected.  Cardiology is called back this morning because the patient's troponin is significantly elevated.  The patient is interviewed and he is currently on a Precedex infusion.  He is awake and alert.  States that he had substernal chest pressure yesterday at the jail.  He has had a few chest pain since then but is currently comfortable with no chest pain at all.  He denies shortness of breath, orthopnea, PND, or heart palpitations.  He did feel weak yesterday at the time of his chest pain initially and he fell to the ground.  Unclear if he lost consciousness.  The patient has no significant past medical history.  He has not seen a physician in recent years.  He admits to regular methamphetamine use.  He does not smoke cigarettes.  He denies a family history of premature CAD.  He denies a personal history of diabetes or hypertension.   History reviewed. No pertinent past medical history.  History reviewed. No pertinent surgical history.   Home Medications:  Prior to Admission medications   Not on File     Inpatient Medications: Scheduled Meds:  aspirin  325 mg Oral Daily   atorvastatin  80 mg Oral Daily   Chlorhexidine Gluconate Cloth  6 each Topical Daily   folic acid  1 mg Intravenous Daily   pantoprazole (PROTONIX) IV  40 mg Intravenous QHS   thiamine injection  100 mg Intravenous Daily   Continuous Infusions:  dexmedetomidine (PRECEDEX) IV infusion 0.4 mcg/kg/hr (11/08/20 1200)   heparin 1,900 Units/hr (11/08/20 1200)   PRN Meds: docusate sodium, LORazepam, polyethylene glycol  Allergies:    Allergies  Allergen Reactions   Haloperidol And Related Anaphylaxis    Face, tongue, and throat swelling    Social History:   Social History   Socioeconomic History   Marital status: Single    Spouse name: Not on file   Number of children: Not on file   Years of education: Not on file   Highest education level: Not on file  Occupational History   Not on file  Tobacco Use   Smoking status: Never   Smokeless tobacco: Never  Substance and Sexual Activity   Alcohol use: Yes   Drug use: Yes    Types: Amphetamines, Methamphetamines, Marijuana   Sexual activity: Not on file  Other Topics Concern   Not on file  Social History Narrative   Not on file   Social Determinants of Health   Financial Resource Strain: Not on file  Food Insecurity: Not on file  Transportation Needs: Not on file  Physical Activity:  Not on file  Stress: Not on file  Social Connections: Not on file  Intimate Partner Violence: Not on file    Family History:   Family History  Problem Relation Age of Onset   Coronary artery disease Neg Hx      ROS:  Please see the history of present illness.  All other ROS reviewed and negative.     Physical Exam/Data:   Vitals:   11/08/20 1000 11/08/20 1100 11/08/20 1139 11/08/20 1200  BP: (!) 144/67 (!) 153/69  (!) 141/55  Pulse: 68 72  67  Resp: 17 17  18   Temp:   97.6 F (36.4 C)   TempSrc:   Axillary   SpO2: 100% 100%  100%  Weight:       Height:        Intake/Output Summary (Last 24 hours) at 11/08/2020 1236 Last data filed at 11/08/2020 1200 Gross per 24 hour  Intake 2977.96 ml  Output 725 ml  Net 2252.96 ml   Last 3 Weights 11/08/2020 11/07/2020 11/07/2020  Weight (lbs) 344 lb 9.3 oz 341 lb 11.4 oz 330 lb 11 oz  Weight (kg) 156.3 kg 155 kg 150 kg     Body mass index is 41.94 kg/m.  General:  Well nourished, well developed, i morbidly obese male in no distress HEENT: normal Lymph: no adenopathy Neck: no JVD Endocrine:  No thryomegaly Vascular: No carotid bruits; FA pulses 2+ bilaterally  Cardiac:  normal S1, S2; RRR; no murmur  Lungs:  clear to auscultation bilaterally, no wheezing, rhonchi or rales  Abd: soft, nontender, no hepatomegaly  Ext: no edema Musculoskeletal:  No deformities, BUE and BLE strength normal and equal Skin: warm and dry  Neuro:  CNs 2-12 intact, no focal abnormalities noted Psych:  Normal affect   EKG:  The EKG was personally reviewed and demonstrates: Normal sinus rhythm with acute anterolateral STEMI pattern with evolving ST changes.  Telemetry:  Telemetry was personally reviewed and demonstrates: Sinus rhythm  Relevant CV Studies: 2D echocardiogram: 1. Left ventricular ejection fraction, by estimation, is 30 to 35%. The  left ventricle has moderately decreased function. The left ventricle  demonstrates regional wall motion abnormalities (see scoring  diagram/findings for description). Left ventricular   diastolic parameters were normal.   2. Right ventricular systolic function is normal. The right ventricular  size is normal. Tricuspid regurgitation signal is inadequate for assessing  PA pressure.   3. The mitral valve is normal in structure. No evidence of mitral valve  regurgitation. No evidence of mitral stenosis.   4. The aortic valve is normal in structure. Aortic valve regurgitation is  trivial. No aortic stenosis is present.   Conclusion(s)/Recommendation(s): No left  ventricular mural or apical  thrombus/thrombi.   Laboratory Data:  High Sensitivity Troponin:   Recent Labs  Lab 11/07/20 0903 11/07/20 1144 11/07/20 2332 11/08/20 0536  TROPONINIHS 23* 377* 13,566* 20,555*     Chemistry Recent Labs  Lab 11/07/20 0903 11/07/20 1128 11/07/20 2332  NA 139 144 138  K 4.3 3.6 3.7  CL 101  --  107  CO2 21*  --  23  GLUCOSE 125*  --  143*  BUN 32*  --  29*  CREATININE 3.08*  --  1.57*  CALCIUM 10.6*  --  8.4*  GFRNONAA 27*  --  60*  ANIONGAP 17*  --  8    Recent Labs  Lab 11/07/20 0903 11/07/20 2332  PROT 10.3* 6.8  ALBUMIN 5.0 3.3*  AST  136* 189*  ALT 79* 61*  ALKPHOS 62 38  BILITOT 1.4* 0.6   Hematology Recent Labs  Lab 11/07/20 0903 11/07/20 1128 11/07/20 2332  WBC 10.3  --  8.6  RBC 6.33*  --  3.17*  HGB 17.2* 15.3 8.6*  HCT 52.4* 45.0 27.6*  MCV 82.8  --  87.1  MCH 27.2  --  27.1  MCHC 32.8  --  31.2  RDW 13.6  --  13.9  PLT 362  --  333   BNPNo results for input(s): BNP, PROBNP in the last 168 hours.  DDimer No results for input(s): DDIMER in the last 168 hours.   Radiology/Studies:  DG Chest Port 1 View  Result Date: 11/07/2020 CLINICAL DATA:  Chest pain and drug overdose EXAM: PORTABLE CHEST 1 VIEW COMPARISON:  None. FINDINGS: A small portion of the lateral left base is not visualized. Visualized lungs are clear. Heart is upper normal in size with pulmonary vascularity normal. No adenopathy. No pneumothorax. No bone lesions. IMPRESSION: No edema or airspace opacity. Note that a small portion of the lateral left base not visualized. Heart upper normal in size. No adenopathy evident. Electronically Signed   By: Bretta BangWilliam  Woodruff III M.D.   On: 11/07/2020 09:32   EEG adult  Result Date: 11/08/2020 Charlsie QuestYadav, Priyanka O, MD     11/08/2020  8:53 AM Patient Name: Bronson CurbKenneth Dacey MRN: [id number] Epilepsy Attending: Charlsie QuestPriyanka O Yadav Referring Physician/Provider: Dr Levon Hedgeraniel Smith Date: 11/07/2020 Duration: 23.24 mins Patient  history: 35 year old male with altered mental status.  EEG to evaluate for seizures. Level of alertness: Awake, asleep AEDs during EEG study: None Technical aspects: This EEG study was done with scalp electrodes positioned according to the 10-20 International system of electrode placement. Electrical activity was acquired at a sampling rate of 500Hz  and reviewed with a high frequency filter of 70Hz  and a low frequency filter of 1Hz . EEG data were recorded continuously and digitally stored. Description: The posterior dominant rhythm consists of 10 Hz activity of moderate voltage (25-35 uV) seen predominantly in posterior head regions, symmetric and reactive to eye opening and eye closing. Sleep was characterized by vertex waves, sleep spindles (12 to 14 Hz), maximal frontocentral region. Hyperventilation and photic stimulation were not performed.   IMPRESSION: This study is within normal limits. No seizures or epileptiform discharges were seen throughout the recording. Charlsie Questriyanka O Yadav   ECHOCARDIOGRAM COMPLETE  Result Date: 11/08/2020    ECHOCARDIOGRAM REPORT   Patient Name:   Bronson CurbKENNETH Stapleton Date of Exam: 11/08/2020 Medical Rec #:  [id number]    Height:       76.0 in Accession #:    [id number]   Weight:       344.6 lb Date of Birth:  12/25/1987    BSA:          2.791 m Patient Age:    32 years     BP:           160/59 mmHg Patient Gender: M            HR:           65 bpm. Exam Location:  Inpatient Procedure: 2D Echo, Cardiac Doppler and Color Doppler Indications:    Acute myocardial infarction  History:        Patient has no prior history of Echocardiogram examinations.  Sonographer:    Shirlean KellyJohn Mendel Brown Referring Phys: [id number] Migdalia DkKORONKWO U OGAN IMPRESSIONS  1. Left ventricular ejection fraction, by estimation, is 30 to  35%. The left ventricle has moderately decreased function. The left ventricle demonstrates regional wall motion abnormalities (see scoring diagram/findings for description). Left ventricular   diastolic parameters were normal.  2. Right ventricular systolic function is normal. The right ventricular size is normal. Tricuspid regurgitation signal is inadequate for assessing PA pressure.  3. The mitral valve is normal in structure. No evidence of mitral valve regurgitation. No evidence of mitral stenosis.  4. The aortic valve is normal in structure. Aortic valve regurgitation is trivial. No aortic stenosis is present. Conclusion(s)/Recommendation(s): No left ventricular mural or apical thrombus/thrombi. FINDINGS  Left Ventricle: Left ventricular ejection fraction, by estimation, is 30 to 35%. The left ventricle has moderately decreased function. The left ventricle demonstrates regional wall motion abnormalities. The left ventricular internal cavity size was normal in size. There is no left ventricular hypertrophy. Left ventricular diastolic parameters were normal.  LV Wall Scoring: The apical lateral segment, apical septal segment, apical anterior segment, and apical inferior segment are akinetic. The mid anteroseptal segment, mid anterolateral segment, mid inferoseptal segment, mid anterior segment, and mid inferior segment are hypokinetic. Right Ventricle: The right ventricular size is normal. No increase in right ventricular wall thickness. Right ventricular systolic function is normal. Tricuspid regurgitation signal is inadequate for assessing PA pressure. Left Atrium: Left atrial size was normal in size. Right Atrium: Right atrial size was normal in size. Pericardium: There is no evidence of pericardial effusion. Mitral Valve: The mitral valve is normal in structure. No evidence of mitral valve regurgitation. No evidence of mitral valve stenosis. Tricuspid Valve: The tricuspid valve is normal in structure. Tricuspid valve regurgitation is trivial. No evidence of tricuspid stenosis. Aortic Valve: The aortic valve is normal in structure. Aortic valve regurgitation is trivial. No aortic stenosis is  present. Aortic valve mean gradient measures 2.0 mmHg. Aortic valve peak gradient measures 3.3 mmHg. Aortic valve area, by VTI measures 3.64 cm. Pulmonic Valve: The pulmonic valve was normal in structure. Pulmonic valve regurgitation is trivial. No evidence of pulmonic stenosis. Aorta: The aortic root is normal in size and structure. Venous: The inferior vena cava was not well visualized. IAS/Shunts: The interatrial septum was not well visualized.  LEFT VENTRICLE PLAX 2D LVIDd:         4.60 cm  Diastology LVIDs:         3.00 cm  LV e' medial:    7.94 cm/s LV PW:         1.00 cm  LV E/e' medial:  10.8 LV IVS:        1.00 cm  LV e' lateral:   7.40 cm/s LVOT diam:     2.30 cm  LV E/e' lateral: 11.6 LV SV:         73 LV SV Index:   26 LVOT Area:     4.15 cm  RIGHT VENTRICLE RV S prime:     6.85 cm/s TAPSE (M-mode): 1.6 cm LEFT ATRIUM             Index       RIGHT ATRIUM           Index LA diam:        4.20 cm 1.50 cm/m  RA Area:     18.00 cm LA Vol (A2C):   61.1 ml 21.89 ml/m RA Volume:   57.40 ml  20.56 ml/m LA Vol (A4C):   54.9 ml 19.67 ml/m LA Biplane Vol: 58.4 ml 20.92 ml/m  AORTIC VALVE AV Area (Vmax):  3.81 cm AV Area (Vmean):   3.92 cm AV Area (VTI):     3.64 cm AV Vmax:           90.50 cm/s AV Vmean:          59.850 cm/s AV VTI:            0.200 m AV Peak Grad:      3.3 mmHg AV Mean Grad:      2.0 mmHg LVOT Vmax:         83.00 cm/s LVOT Vmean:        56.500 cm/s LVOT VTI:          0.175 m LVOT/AV VTI ratio: 0.88  AORTA Ao Root diam: 3.20 cm Ao Asc diam:  2.90 cm MITRAL VALVE MV Area (PHT): 4.46 cm    SHUNTS MV Decel Time: 170 msec    Systemic VTI:  0.18 m MV E velocity: 85.50 cm/s  Systemic Diam: 2.30 cm MV A velocity: 83.60 cm/s MV E/A ratio:  1.02 Weston Brass MD Electronically signed by Weston Brass MD Signature Date/Time: 11/08/2020/9:55:27 AM    Final    [place] EKG SITE RITE  Result Date: 11/08/2020 If Site Rite image not attached, placement could not be confirmed due to current cardiac  rhythm.    Assessment and Plan:   Anterolateral STEMI, evolving EKG changes now with resolution of chest pain Polysubstance abuse Encephalopathy secondary to #2 Anemia, with acute drop in hemoglobin from 15.3--->8.6 Acute kidney injury, improving with initial creatinine 3.08 improved to 1.57  EKGs are reviewed from presentation.  He initially had marked ST elevation but in a very diffuse pattern not typical of any coronary artery distribution.  However, on this morning's EKG he demonstrates the presence of Q waves and residual ST elevation anterolaterally and inferiorly.  I suspect he has had an anterolateral infarction and a wraparound LAD vessel that is also affected the distal inferior wall.  The patient's echocardiogram shows severe LV systolic dysfunction with LVEF 30 to 35% and LAD territory infarction pattern.  ST segment elevation persists but has improved significantly from his initial EKG.  The patient is now greater than 24 hours out from his initial presentation.  His troponin has increased from 23 --->377---> 13,566---> 20,555.  The patient was not appropriate for cardiac catheterization yesterday because of severe acute kidney injury, encephalopathy, and again pattern of ST elevation that did not fit any specific coronary territory.  He is now chest pain-free.  I think cardiac catheterization is indicated to assess his coronary anatomy.  I am concerned that his hemoglobin has dropped from 15.3-8.6.  Will discuss with CCM team and evaluate for potential bleeding source.  Also need to address issues of informed consent in this patient who is currently on Precedex.  Because the patient came from jail, I am unable to contact his family at present.  I have placed a call to the deputy and I am waiting a callback to see if his family can be contacted.  We will follow-up closely.  For now would follow serial hemoglobin, manage medically, and anticipate cardiac catheterization in next 24 hours as  long as his renal function continues to improve and his hemoglobin stabilizes.  For medical therapy: continue ASA. Add statin, beta blocker. Hold ACE/ARB/Aldactone today because of AKI.    Risk Assessment/Risk Scores:     TIMI Risk Score for ST  Elevation MI:   The patient's TIMI risk score is 4, which indicates a  7.3% risk of all cause mortality at 30 days.    For questions or updates, please contact [facility] Please consult [email or web address] for contact info under    Signed, Tonny Bollman, MD  11/08/2020 12:36 PM

## 2020-11-08 NOTE — Progress Notes (Signed)
NAME:  Ricky Berg, MRN:  [id number], DOB:  12/25/1987, LOS: 1 ADMISSION DATE:  11/07/2020, CONSULTATION DATE:  11/07/2020 REFERRING MD: Dr Anitra Lauth, EDP CHIEF COMPLAINT:  AMS    History of Present Illness:  35 year old male presented from local jail house while being processed for doing some chemicals. He presented for altered mental status. UDS positive for amphetamines, opiates and marijuana. He had complaint of chest pain with troponin of 29 which was evaluated by cardiology and thought to be secondary to vasospasm. No intervention at the time. PCCM consulted for agitation, positive cardiac enzymes with lactic acidosis and renal insufficiency. Patient admitted to ICU for precedex gtt for agitation and management of other issues.    Pertinent  Medical History  No past medical history on file.  Significant Hospital Events: Including procedures, antibiotic start and stop dates in addition to other pertinent events   11/08/19 admitted to ICU for agitation requiring precedex gtt + STEMI attributed to coronary vasospasm  6/29 troponin elevated to 13k > 20k, EKG with ST elevation in II, III, avF and V2-V6 patient started on heparin gtt  Interim History / Subjective:  Agitation is improved on precedex gtt. Patient noted to have elevated troponins to 13K, uptrending to 20K. EKG with ST elevation in leads II,III,aVF and V2-6  Objective   Blood pressure (!) 160/59, pulse 65, temperature (!) 97.3 F (36.3 C), temperature source Axillary, resp. rate 14, height 6\' 4"  (1.93 m), weight (!) 156.3 kg, SpO2 96 %.    FiO2 (%):  [28 %] 28 %   Intake/Output Summary (Last 24 hours) at 11/08/2020 0818 Last data filed at 11/08/2020 0600 Gross per 24 hour  Intake 3945.54 ml  Output --  Net 3945.54 ml   Filed Weights   11/07/20 1312 11/07/20 1400 11/08/20 0500  Weight: (!) 150 kg (!) 155 kg (!) 156.3 kg    Examination: General: morbidly obese male, no acute distress, resting comfortably in bed HENT:  [place]/AT, MMM, EOMI Lungs: CTAB, on room air  Cardiovascular: RRR, S1 and S2 present Abdomen: obese, soft, nontender, +BS Extremities: warm/dry Neuro: Aox3, no apparent focal deficits   Resolved Hospital Problem list     Assessment & Plan:  Acute encephalopathy 2/2 chemical ingestion UDS +ve for amphetamines, opiates, and THC. Salicylate and acetaminophen levels wnl. Has been on precedex gtt. Currently resting comfortably in bed. No physical restraints. Does not appear to be in acute withdrawal at this time. EEG without epileptiform activity noted. Law enforcement remains at bedside. 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 Serial troponins trending up 23>377>13,566>20,555. EKG with elevations in II, III, aVF and V2-6. Echo with LVEF 30-35% with akinetic apex and hypokinetic anterolateral and inferoseptal segment and mid anterior and inferior segments.  - Cardiology following, appreciate recommendations - IV heparin gtt  - Continue aspirin 325mg  daily and lipitor 80mg  daily   Acute renal failure Lactic acidosis  Unknown baseline renal function. sCr 3>1.5 with 2L fluid resuscitation. Does have lactic acidosis but infectious work up negative thus far. Possibly may be in setting of acute STEMI with impaired cardiac function. Question whether patient had hypotensive episode while in holding cell of jail when he became minimally responsive with significant diaphoresis prior to EMS arrival.  - Trend renal function - Trend lactic acidosis, fluid resuscitation prn although would be cautious in setting of reduced EF as  noted above   Acute normocytic anemia: Hb 17>8.6. Unknown baseline Hb. He does not have any signs of acute bleed at this time. Suspect that this may be in setting of fluid resuscitation as he did receive 2L LR's in ED with a decrease in all cell lines.  - Continue to trend  CBC  Transaminitis  Likely 2/2 shock liver in setting of possible transient hypotension as above.  - RUQ [place] - Trend CMP   Best Practice (right click and "Reselect all SmartList Selections" daily)   Diet/type: clear liquids DVT prophylaxis: systemic heparin GI prophylaxis: PPI Lines: N/A Foley:  N/A Code Status:  full code Last date of multidisciplinary goals of care discussion [patient updated at bedside 6/29, no family listed, under supervision of [employer]]  Critical care time: 32 minutes    Eliezer Bottom, MD Internal Medicine, PGY-2 11/08/20 8:19 AM Pager # [phone]

## 2020-11-08 NOTE — Procedures (Signed)
Patient Name: Ahamed Hofland  MRN: [id number]  Epilepsy Attending: Charlsie Quest  Referring Physician/Provider: Dr Levon Hedger Date: 11/07/2020 Duration: 23.24 mins  Patient history: 35 year old male with altered mental status.  EEG to evaluate for seizures.  Level of alertness: Awake, asleep  AEDs during EEG study: None  Technical aspects: This EEG study was done with scalp electrodes positioned according to the 10-20 International system of electrode placement. Electrical activity was acquired at a sampling rate of 500Hz  and reviewed with a high frequency filter of 70Hz  and a low frequency filter of 1Hz . EEG data were recorded continuously and digitally stored.   Description: The posterior dominant rhythm consists of 10 Hz activity of moderate voltage (25-35 uV) seen predominantly in posterior head regions, symmetric and reactive to eye opening and eye closing. Sleep was characterized by vertex waves, sleep spindles (12 to 14 Hz), maximal frontocentral region. Hyperventilation and photic stimulation were not performed.     IMPRESSION: This study is within normal limits. No seizures or epileptiform discharges were seen throughout the recording.   Hayes Rehfeldt 

## 2020-11-09 ENCOUNTER — Encounter (HOSPITAL_COMMUNITY)
Admission: EM | Disposition: A | Discharge status: Home / Self Care | Payer: Self-pay | Attending: Critical Care Medicine

## 2020-11-09 ENCOUNTER — Inpatient Hospital Stay (HOSPITAL_COMMUNITY)

## 2020-11-09 DIAGNOSIS — I2510 Atherosclerotic heart disease of native coronary artery without angina pectoris: Secondary | ICD-10-CM

## 2020-11-09 DIAGNOSIS — D649 Anemia, unspecified: Secondary | ICD-10-CM

## 2020-11-09 DIAGNOSIS — I2102 ST elevation (STEMI) myocardial infarction involving left anterior descending coronary artery: Secondary | ICD-10-CM

## 2020-11-09 DIAGNOSIS — I5021 Acute systolic (congestive) heart failure: Secondary | ICD-10-CM

## 2020-11-09 HISTORY — PX: LEFT HEART CATH AND CORONARY ANGIOGRAPHY: CATH118249

## 2020-11-09 LAB — CBC
HCT: 42.3 % (ref 39.0–52.0)
Hemoglobin: 13.6 g/dL (ref 13.0–17.0)
MCH: 26.6 pg (ref 26.0–34.0)
MCHC: 32.2 g/dL (ref 30.0–36.0)
MCV: 82.6 fL (ref 80.0–100.0)
Platelets: 202 10*3/uL (ref 150–400)
RBC: 5.12 MIL/uL (ref 4.22–5.81)
RDW: 13.4 % (ref 11.5–15.5)
WBC: 7.8 10*3/uL (ref 4.0–10.5)
nRBC: 0 % (ref 0.0–0.2)

## 2020-11-09 LAB — COMPREHENSIVE METABOLIC PANEL
ALT: 88 U/L — ABNORMAL HIGH (ref 0–44)
AST: 225 U/L — ABNORMAL HIGH (ref 15–41)
Albumin: 3.7 g/dL (ref 3.5–5.0)
Alkaline Phosphatase: 50 U/L (ref 38–126)
Anion gap: 8 (ref 5–15)
BUN: 14 mg/dL (ref 6–20)
CO2: 24 mmol/L (ref 22–32)
Calcium: 8.5 mg/dL — ABNORMAL LOW (ref 8.9–10.3)
Chloride: 99 mmol/L (ref 98–111)
Creatinine, Ser: 1.01 mg/dL (ref 0.61–1.24)
GFR, Estimated: >60 mL/min (ref >60)
Glucose, Bld: 120 mg/dL — ABNORMAL HIGH (ref 70–99)
Potassium: 5.3 mmol/L — ABNORMAL HIGH (ref 3.5–5.1)
Sodium: 131 mmol/L — ABNORMAL LOW (ref 135–145)
Total Bilirubin: 1.9 mg/dL — ABNORMAL HIGH (ref 0.3–1.2)
Total Protein: 7.6 g/dL (ref 6.5–8.1)

## 2020-11-09 LAB — LIPID PANEL
Cholesterol: 132 mg/dL (ref 0–200)
HDL: 28 mg/dL — ABNORMAL LOW (ref >40)
LDL Cholesterol: 84 mg/dL (ref 0–99)
Total CHOL/HDL Ratio: 4.7 RATIO
Triglycerides: 101 mg/dL (ref <150)
VLDL: 20 mg/dL (ref 0–40)

## 2020-11-09 LAB — POTASSIUM: Potassium: 3.5 mmol/L (ref 3.5–5.1)

## 2020-11-09 LAB — HEPATITIS PANEL, ACUTE
HCV Ab: NONREACTIVE
Hep A IgM: NONREACTIVE
Hep B C IgM: NONREACTIVE
Hepatitis B Surface Ag: NONREACTIVE

## 2020-11-09 LAB — STREP PNEUMONIAE URINARY ANTIGEN: Strep Pneumo Urinary Antigen: NEGATIVE

## 2020-11-09 LAB — HEPARIN LEVEL (UNFRACTIONATED): Heparin Unfractionated: 0.42 IU/mL (ref 0.30–0.70)

## 2020-11-09 SURGERY — LEFT HEART CATH AND CORONARY ANGIOGRAPHY
Anesthesia: LOCAL

## 2020-11-09 MED ORDER — IOHEXOL 350 MG/ML SOLN
INTRAVENOUS | Status: DC | PRN
  Administered 2020-11-09: 85 mL

## 2020-11-09 MED ORDER — HEPARIN SODIUM (PORCINE) 1000 UNIT/ML IJ SOLN
INTRAMUSCULAR | Status: DC | PRN
  Administered 2020-11-09: 6000 [IU] via INTRAVENOUS

## 2020-11-09 MED ORDER — FENTANYL CITRATE (PF) 100 MCG/2ML IJ SOLN
INTRAMUSCULAR | Status: DC | PRN
  Administered 2020-11-09: 25 ug via INTRAVENOUS

## 2020-11-09 MED ORDER — COLCHICINE 0.6 MG PO TABS
0.6000 mg | ORAL_TABLET | Freq: Two times a day (BID) | ORAL | Status: DC
  Administered 2020-11-09 – 2020-11-11 (×4): 0.6 mg via ORAL
  Filled 2020-11-09 (×5): qty 1

## 2020-11-09 MED ORDER — VERAPAMIL HCL 2.5 MG/ML IV SOLN
INTRAVENOUS | Status: AC
  Filled 2020-11-09: qty 2

## 2020-11-09 MED ORDER — SODIUM CHLORIDE 0.9 % IV SOLN
250.0000 mL | INTRAVENOUS | Status: DC | PRN
Start: 2020-11-09 — End: 2020-11-11

## 2020-11-09 MED ORDER — SODIUM CHLORIDE 0.9% FLUSH
10.0000 mL | Freq: Two times a day (BID) | INTRAVENOUS | Status: DC
  Administered 2020-11-09 – 2020-11-10 (×2): 20 mL
  Administered 2020-11-10 – 2020-11-11 (×2): 10 mL

## 2020-11-09 MED ORDER — SODIUM CHLORIDE 0.9% FLUSH: 3.0000 mL | INTRAVENOUS | Status: DC | PRN

## 2020-11-09 MED ORDER — MIDAZOLAM HCL 2 MG/2ML IJ SOLN
INTRAMUSCULAR | Status: DC | PRN
  Administered 2020-11-09: 1 mg via INTRAVENOUS

## 2020-11-09 MED ORDER — SODIUM CHLORIDE 0.9% FLUSH: 10.0000 mL | INTRAVENOUS | Status: DC | PRN

## 2020-11-09 MED ORDER — SODIUM CHLORIDE 0.9% FLUSH: 3.0000 mL | Freq: Two times a day (BID) | INTRAVENOUS | Status: DC

## 2020-11-09 MED ORDER — SODIUM CHLORIDE 0.9% FLUSH
3.0000 mL | Freq: Two times a day (BID) | INTRAVENOUS | Status: DC
  Administered 2020-11-09 – 2020-11-11 (×3): 3 mL via INTRAVENOUS

## 2020-11-09 MED ORDER — THIAMINE HCL 100 MG PO TABS
100.0000 mg | ORAL_TABLET | Freq: Every day | ORAL | Status: DC
  Administered 2020-11-10 – 2020-11-11 (×2): 100 mg via ORAL
  Filled 2020-11-09 (×2): qty 1

## 2020-11-09 MED ORDER — FUROSEMIDE 10 MG/ML IJ SOLN
40.0000 mg | Freq: Once | INTRAMUSCULAR | Status: AC
  Administered 2020-11-09: 40 mg via INTRAVENOUS
  Filled 2020-11-09: qty 4

## 2020-11-09 MED ORDER — LIDOCAINE HCL (PF) 1 % IJ SOLN
INTRAMUSCULAR | Status: DC | PRN
  Administered 2020-11-09: 2 mL via SUBCUTANEOUS

## 2020-11-09 MED ORDER — HEPARIN SODIUM (PORCINE) 5000 UNIT/ML IJ SOLN
5000.0000 [IU] | Freq: Three times a day (TID) | INTRAMUSCULAR | Status: DC
  Administered 2020-11-09 – 2020-11-11 (×5): 5000 [IU] via SUBCUTANEOUS
  Filled 2020-11-09 (×5): qty 1

## 2020-11-09 MED ORDER — MIDAZOLAM HCL 2 MG/2ML IJ SOLN
INTRAMUSCULAR | Status: AC
  Filled 2020-11-09: qty 2

## 2020-11-09 MED ORDER — ASPIRIN 81 MG PO CHEW
81.0000 mg | CHEWABLE_TABLET | ORAL | Status: AC
  Administered 2020-11-09: 81 mg via ORAL
  Filled 2020-11-09: qty 1

## 2020-11-09 MED ORDER — SODIUM CHLORIDE 0.9 % IV SOLN: 250.0000 mL | INTRAVENOUS | Status: DC | PRN

## 2020-11-09 MED ORDER — FENTANYL CITRATE (PF) 100 MCG/2ML IJ SOLN
INTRAMUSCULAR | Status: AC
  Filled 2020-11-09: qty 2

## 2020-11-09 MED ORDER — ISOSORBIDE MONONITRATE ER 30 MG PO TB24
30.0000 mg | ORAL_TABLET | Freq: Every day | ORAL | Status: DC
  Administered 2020-11-09 – 2020-11-11 (×3): 30 mg via ORAL
  Filled 2020-11-09 (×3): qty 1

## 2020-11-09 MED ORDER — SODIUM CHLORIDE 0.9 % IV SOLN: INTRAVENOUS | Status: DC

## 2020-11-09 MED ORDER — HEPARIN (PORCINE) IN NACL 1000-0.9 UT/500ML-% IV SOLN
INTRAVENOUS | Status: AC
  Filled 2020-11-09: qty 1000

## 2020-11-09 MED ORDER — LIDOCAINE HCL (PF) 1 % IJ SOLN
INTRAMUSCULAR | Status: AC
  Filled 2020-11-09: qty 30

## 2020-11-09 MED ORDER — HEPARIN (PORCINE) IN NACL 1000-0.9 UT/500ML-% IV SOLN
INTRAVENOUS | Status: DC | PRN
  Administered 2020-11-09 (×2): 500 mL

## 2020-11-09 MED ORDER — POTASSIUM CHLORIDE CRYS ER 20 MEQ PO TBCR
40.0000 meq | EXTENDED_RELEASE_TABLET | Freq: Once | ORAL | Status: AC
  Administered 2020-11-09: 40 meq via ORAL
  Filled 2020-11-09: qty 2

## 2020-11-09 MED ORDER — HEPARIN SODIUM (PORCINE) 1000 UNIT/ML IJ SOLN
INTRAMUSCULAR | Status: AC
  Filled 2020-11-09: qty 1

## 2020-11-09 MED ORDER — VERAPAMIL HCL 2.5 MG/ML IV SOLN
INTRAVENOUS | Status: DC | PRN
  Administered 2020-11-09: 10 mL via INTRA_ARTERIAL

## 2020-11-09 SURGICAL SUPPLY — 11 items
CATH 5FR JL3.5 JR4 ANG PIG MP (CATHETERS) ×2 IMPLANT
CATH LAUNCHER 5F RADL (CATHETERS) ×1 IMPLANT
CATHETER LAUNCHER 5F RADL (CATHETERS) ×2
DEVICE RAD COMP TR BAND LRG (VASCULAR PRODUCTS) ×2 IMPLANT
GLIDESHEATH SLEND SS 6F .021 (SHEATH) ×2 IMPLANT
KIT HEART LEFT (KITS) ×2 IMPLANT
PACK CARDIAC CATHETERIZATION (CUSTOM PROCEDURE TRAY) ×2 IMPLANT
TRANSDUCER W/STOPCOCK (MISCELLANEOUS) ×2 IMPLANT
TUBING CIL FLEX 10 FLL-RA (TUBING) ×2 IMPLANT
WIRE EMERALD 3MM-J .035X260CM (WIRE) ×2 IMPLANT
WIRE HI TORQ VERSACORE-J 145CM (WIRE) ×2 IMPLANT

## 2020-11-09 NOTE — Progress Notes (Signed)
ANTICOAGULATION CONSULT NOTE  Pharmacy Consult fo Heparin Indication: chest pain/ACS  Allergies  Allergen Reactions   Haloperidol And Related Anaphylaxis    Face, tongue, and throat swelling    Patient Measurements: Height: 6\' 4"  (193 cm) Weight: (!) 155.7 kg (343 lb 4.1 oz) IBW/kg (Calculated) : 86.8 Heparin Dosing Weight: 120 kg  Vital Signs: Temp: 98.2 F (36.8 C) (06/30 0327) Temp Source: Oral (06/30 0327) BP: 163/90 (06/30 0600) Pulse Rate: 91 (06/30 0600)  Labs: Recent Labs    11/07/20 2332 11/08/20 0536 11/08/20 1854 11/09/20 0125  HGB 8.6*  --  13.2 13.6  HCT 27.6*  --  41.3 42.3  PLT 333  --  206 202  APTT 24  --  40*  --   LABPROT 14.8  --  13.9  --   INR 1.2  --  1.1  --   HEPARINUNFRC  --   --  0.25* 0.42  CREATININE 1.57*  --  1.07 1.01  TROPONINIHS 13,566* 20,555* 22,828*  --     Estimated Creatinine Clearance: 166.8 mL/min (by C-G formula based on SCr of 1.01 mg/dL).   Assessment: 35 y.o. male admitted with chest pain and elevated troponin.  Pharmacy consulted to dose IV heparin.  Heparin level is therapeutic at 0.42 on current rate of 2150 units/hr.  No issue with heparin infusion nor bleeding per RN. Plans for Cardiac Cath when consent able to be obtained.   Goal of Therapy:  Heparin level 0.3-0.7 units/ml Monitor platelets by anticoagulation protocol: Yes   Plan:  Continue heparin gtt to 2150 units/hr. Follow-up plan for Cardiac Cath Monitor daily Heparin level and CBC  2151, PharmD, BCPS, BCCCP Clinical Pharmacist Please refer to [facility] for [facility] numbers 11/09/2020, 7:03 AM

## 2020-11-09 NOTE — Interval H&P Note (Signed)
History and Physical Interval Note:  11/09/2020 11:17 AM  Ricky Berg  has presented today for surgery, with the diagnosis of chest pain.  The various methods of treatment have been discussed with the patient and family. After consideration of risks, benefits and other options for treatment, the patient has consented to  Procedure(s): LEFT HEART CATH AND CORONARY ANGIOGRAPHY (N/A) as a surgical intervention.  The patient's history has been reviewed, patient examined, no change in status, stable for surgery.  I have reviewed the patient's chart and labs.  Questions were answered to the patient's satisfaction.   Cath Lab Visit (complete for each Cath Lab visit)  Clinical Evaluation Leading to the Procedure:   ACS: Yes.    Non-ACS:    Anginal Classification: CCS IV  Anti-ischemic medical therapy: No Therapy  Non-Invasive Test Results: No non-invasive testing performed  Prior CABG: No previous CABG        [name] [facility] 11/09/2020 11:17 AM

## 2020-11-09 NOTE — H&P (View-Only) (Signed)
Progress Note  Patient Name: Ricky Berg Date of Encounter: 11/09/2020  [facility] Cardiologist: None   Subjective   Complaining of chest pain this morning.  Mostly pleuritic type pain with deep breathing and worse with lying down.  No shortness of breath.  Prepared for cardiac catheterization.  Questions are answered this morning.  Inpatient Medications    Scheduled Meds:  aspirin EC  81 mg Oral Daily   atorvastatin  80 mg Oral Daily   carvedilol  3.125 mg Oral BID WC   Chlorhexidine Gluconate Cloth  6 each Topical Daily   folic acid  1 mg Oral Daily   pantoprazole  40 mg Oral QHS   sodium chloride flush  3 mL Intravenous Q12H   thiamine injection  100 mg Intravenous Daily   Continuous Infusions:  sodium chloride     sodium chloride 50 mL/hr at 11/09/20 0609   dexmedetomidine (PRECEDEX) IV infusion Stopped (11/08/20 1559)   heparin 2,150 Units/hr (11/09/20 0550)   PRN Meds: sodium chloride, docusate sodium, LORazepam, morphine injection, nitroGLYCERIN, polyethylene glycol, sodium chloride flush   Vital Signs    Vitals:   11/09/20 0500 11/09/20 0600 11/09/20 0700 11/09/20 0832  BP: (!) 172/82 (!) 163/90  135/81  Pulse: (!) 102 91  100  Resp: (!) 21 (!) 21    Temp:   99.3 F (37.4 C)   TempSrc:   Oral   SpO2: 98% 99%    Weight:      Height:        Intake/Output Summary (Last 24 hours) at 11/09/2020 0935 Last data filed at 11/09/2020 0800 Gross per 24 hour  Intake 1413.59 ml  Output 2525 ml  Net -1111.41 ml   Last 3 Weights 11/09/2020 11/08/2020 11/07/2020  Weight (lbs) 343 lb 4.1 oz 344 lb 9.3 oz 341 lb 11.4 oz  Weight (kg) 155.7 kg 156.3 kg 155 kg      Telemetry    Sinus rhythm/sinus tachycardia- Personally Reviewed   Physical Exam  Alert, oriented, obese male in NAD GEN: No acute distress.   Neck: No JVD Cardiac: Mildly tachycardic and regular, no murmurs, rubs, or gallops.  Respiratory: Clear to auscultation bilaterally. GI: Soft, nontender,  non-distended  MS: No edema; No deformity. Neuro:  Nonfocal  Psych: Normal affect   Labs    High Sensitivity Troponin:   Recent Labs  Lab 11/07/20 0903 11/07/20 1144 11/07/20 2332 11/08/20 0536 11/08/20 1854  TROPONINIHS 23* 377* 13,566* 20,555* 22,828*      Chemistry Recent Labs  Lab 11/07/20 2332 11/08/20 1854 11/09/20 0125 11/09/20 0551  NA 138 133* 131*  --   K 3.7 3.7 5.3* 3.5  CL 107 99 99  --   CO2 23 24 24   --   GLUCOSE 143* 72 120*  --   BUN 29* 18 14  --   CREATININE 1.57* 1.07 1.01  --   CALCIUM 8.4* 8.7* 8.5*  --   PROT 6.8 7.5 7.6  --   ALBUMIN 3.3* 3.6 3.7  --   AST 189* 236* 225*  --   ALT 61* 80* 88*  --   ALKPHOS 38 50 50  --   BILITOT 0.6 0.9 1.9*  --   GFRNONAA 60* >60 >60  --   ANIONGAP 8 10 8   --      Hematology Recent Labs  Lab 11/07/20 2332 11/08/20 1854 11/09/20 0125  WBC 8.6 5.8 7.8  RBC 3.17* 4.95 5.12  HGB 8.6* 13.2 13.6  HCT 27.6* 41.3 42.3  MCV 87.1 83.4 82.6  MCH 27.1 26.7 26.6  MCHC 31.2 32.0 32.2  RDW 13.9 13.3 13.4  PLT 333 206 202    BNPNo results for input(s): BNP, PROBNP in the last 168 hours.   DDimer No results for input(s): DDIMER in the last 168 hours.   Radiology    EEG adult  Result Date: 11/08/2020 Charlsie QuestYadav, Priyanka O, MD     11/08/2020  8:53 AM Patient Name: Ricky CurbKenneth Berg MRN: [id number] Epilepsy Attending: Charlsie QuestPriyanka O Yadav Referring Physician/Provider: Dr Levon Hedgeraniel Smith Date: 11/07/2020 Duration: 23.24 mins Patient history: 35 year old male with altered mental status.  EEG to evaluate for seizures. Level of alertness: Awake, asleep AEDs during EEG study: None Technical aspects: This EEG study was done with scalp electrodes positioned according to the 10-20 International system of electrode placement. Electrical activity was acquired at a sampling rate of 500Hz  and reviewed with a high frequency filter of 70Hz  and a low frequency filter of 1Hz . EEG data were recorded continuously and digitally stored. Description:  The posterior dominant rhythm consists of 10 Hz activity of moderate voltage (25-35 uV) seen predominantly in posterior head regions, symmetric and reactive to eye opening and eye closing. Sleep was characterized by vertex waves, sleep spindles (12 to 14 Hz), maximal frontocentral region. Hyperventilation and photic stimulation were not performed.   IMPRESSION: This study is within normal limits. No seizures or epileptiform discharges were seen throughout the recording. Charlsie Questriyanka O Yadav   ECHOCARDIOGRAM COMPLETE  Result Date: 11/08/2020    ECHOCARDIOGRAM REPORT   Patient Name:   Ricky CurbKENNETH Berg Date of Exam: 11/08/2020 Medical Rec #:  [id number]    Height:       76.0 in Accession #:    [id number]   Weight:       344.6 lb Date of Birth:  12/25/1987    BSA:          2.791 m Patient Age:    32 years     BP:           160/59 mmHg Patient Gender: M            HR:           65 bpm. Exam Location:  Inpatient Procedure: 2D Echo, Cardiac Doppler and Color Doppler Indications:    Acute myocardial infarction  History:        Patient has no prior history of Echocardiogram examinations.  Sonographer:    Shirlean KellyJohn Mendel Brown Referring Phys: [id number] Migdalia DkKORONKWO U OGAN IMPRESSIONS  1. Left ventricular ejection fraction, by estimation, is 30 to 35%. The left ventricle has moderately decreased function. The left ventricle demonstrates regional wall motion abnormalities (see scoring diagram/findings for description). Left ventricular  diastolic parameters were normal.  2. Right ventricular systolic function is normal. The right ventricular size is normal. Tricuspid regurgitation signal is inadequate for assessing PA pressure.  3. The mitral valve is normal in structure. No evidence of mitral valve regurgitation. No evidence of mitral stenosis.  4. The aortic valve is normal in structure. Aortic valve regurgitation is trivial. No aortic stenosis is present. Conclusion(s)/Recommendation(s): No left ventricular mural or apical  thrombus/thrombi. FINDINGS  Left Ventricle: Left ventricular ejection fraction, by estimation, is 30 to 35%. The left ventricle has moderately decreased function. The left ventricle demonstrates regional wall motion abnormalities. The left ventricular internal cavity size was normal in size. There is no left ventricular hypertrophy. Left ventricular diastolic parameters were normal.  LV Wall Scoring:  The apical lateral segment, apical septal segment, apical anterior segment, and apical inferior segment are akinetic. The mid anteroseptal segment, mid anterolateral segment, mid inferoseptal segment, mid anterior segment, and mid inferior segment are hypokinetic. Right Ventricle: The right ventricular size is normal. No increase in right ventricular wall thickness. Right ventricular systolic function is normal. Tricuspid regurgitation signal is inadequate for assessing PA pressure. Left Atrium: Left atrial size was normal in size. Right Atrium: Right atrial size was normal in size. Pericardium: There is no evidence of pericardial effusion. Mitral Valve: The mitral valve is normal in structure. No evidence of mitral valve regurgitation. No evidence of mitral valve stenosis. Tricuspid Valve: The tricuspid valve is normal in structure. Tricuspid valve regurgitation is trivial. No evidence of tricuspid stenosis. Aortic Valve: The aortic valve is normal in structure. Aortic valve regurgitation is trivial. No aortic stenosis is present. Aortic valve mean gradient measures 2.0 mmHg. Aortic valve peak gradient measures 3.3 mmHg. Aortic valve area, by VTI measures 3.64 cm. Pulmonic Valve: The pulmonic valve was normal in structure. Pulmonic valve regurgitation is trivial. No evidence of pulmonic stenosis. Aorta: The aortic root is normal in size and structure. Venous: The inferior vena cava was not well visualized. IAS/Shunts: The interatrial septum was not well visualized.  LEFT VENTRICLE PLAX 2D LVIDd:         4.60 cm   Diastology LVIDs:         3.00 cm  LV e' medial:    7.94 cm/s LV PW:         1.00 cm  LV E/e' medial:  10.8 LV IVS:        1.00 cm  LV e' lateral:   7.40 cm/s LVOT diam:     2.30 cm  LV E/e' lateral: 11.6 LV SV:         73 LV SV Index:   26 LVOT Area:     4.15 cm  RIGHT VENTRICLE RV S prime:     6.85 cm/s TAPSE (M-mode): 1.6 cm LEFT ATRIUM             Index       RIGHT ATRIUM           Index LA diam:        4.20 cm 1.50 cm/m  RA Area:     18.00 cm LA Vol (A2C):   61.1 ml 21.89 ml/m RA Volume:   57.40 ml  20.56 ml/m LA Vol (A4C):   54.9 ml 19.67 ml/m LA Biplane Vol: 58.4 ml 20.92 ml/m  AORTIC VALVE AV Area (Vmax):    3.81 cm AV Area (Vmean):   3.92 cm AV Area (VTI):     3.64 cm AV Vmax:           90.50 cm/s AV Vmean:          59.850 cm/s AV VTI:            0.200 m AV Peak Grad:      3.3 mmHg AV Mean Grad:      2.0 mmHg LVOT Vmax:         83.00 cm/s LVOT Vmean:        56.500 cm/s LVOT VTI:          0.175 m LVOT/AV VTI ratio: 0.88  AORTA Ao Root diam: 3.20 cm Ao Asc diam:  2.90 cm MITRAL VALVE MV Area (PHT): 4.46 cm    SHUNTS MV Decel Time: 170 msec    Systemic  VTI:  0.18 m MV E velocity: 85.50 cm/s  Systemic Diam: 2.30 cm MV A velocity: 83.60 cm/s MV E/A ratio:  1.02 Weston Brass MD Electronically signed by Weston Brass MD Signature Date/Time: 11/08/2020/9:55:27 AM    Final    [place] EKG SITE RITE  Result Date: 11/08/2020 If Site Rite image not attached, placement could not be confirmed due to current cardiac rhythm.  US Abdomen Limited RUQ (LIVER/GB)  Result Date: 11/09/2020 CLINICAL DATA:  Transaminitis. EXAM: ULTRASOUND ABDOMEN LIMITED RIGHT UPPER QUADRANT COMPARISON:  No prior. FINDINGS: Gallbladder: No gallstones or wall thickening visualized. No sonographic Murphy sign noted by sonographer. Common bile duct: Diameter: 4.4 Liver: Increased echogenicity consistent fatty infiltration or hepatocellular disease. Portal vein is patent on color Doppler imaging with normal direction of blood flow  towards the liver. Other: Suboptimal exam due to patient's body habitus. IMPRESSION: 1. Suboptimal exam due to patient's body habitus. No gallstones or biliary distention. 2. Increased hepatic echogenicity consistent fatty infiltration or hepatocellular disease. Electronically Signed   By: Maisie Fus  Register   On: 11/09/2020 07:22    Cardiac Studies   Echo: FINDINGS   Left Ventricle: Left ventricular ejection fraction, by estimation, is 30  to 35%. The left ventricle has moderately decreased function. The left  ventricle demonstrates regional wall motion abnormalities. The left  ventricular internal cavity size was  normal in size. There is no left ventricular hypertrophy. Left ventricular  diastolic parameters were normal.      LV Wall Scoring:  The apical lateral segment, apical septal segment, apical anterior  segment,  and apical inferior segment are akinetic. The mid anteroseptal segment,  mid  anterolateral segment, mid inferoseptal segment, mid anterior segment, and  mid inferior segment are hypokinetic.   Right Ventricle: The right ventricular size is normal. No increase in  right ventricular wall thickness. Right ventricular systolic function is  normal. Tricuspid regurgitation signal is inadequate for assessing PA  pressure.   Left Atrium: Left atrial size was normal in size.   Right Atrium: Right atrial size was normal in size.   Pericardium: There is no evidence of pericardial effusion.   Mitral Valve: The mitral valve is normal in structure. No evidence of  mitral valve regurgitation. No evidence of mitral valve stenosis.   Tricuspid Valve: The tricuspid valve is normal in structure. Tricuspid  valve regurgitation is trivial. No evidence of tricuspid stenosis.   Aortic Valve: The aortic valve is normal in structure. Aortic valve  regurgitation is trivial. No aortic stenosis is present. Aortic valve mean  gradient measures 2.0 mmHg. Aortic valve peak gradient  measures 3.3 mmHg.  Aortic valve area, by VTI measures  3.64 cm.   Pulmonic Valve: The pulmonic valve was normal in structure. Pulmonic valve  regurgitation is trivial. No evidence of pulmonic stenosis.   Aorta: The aortic root is normal in size and structure.   Venous: The inferior vena cava was not well visualized.   IAS/Shunts: The interatrial septum was not well visualized.   Patient Profile     35 y.o. male with polysubstance abuse presents with anterior MI, complicated presentation with acute agitation, drug overdose, acute kidney injury, anemia  Assessment & Plan    Anterior MI: on heparin, severe LV dysfunction in pattern of LAD territory infarction. Cath today. I have reviewed the risks, indications, and alternatives to cardiac catheterization, possible angioplasty, and stenting with the patient. Risks include but are not limited to bleeding, infection, vascular injury, stroke, myocardial infection, arrhythmia, kidney  injury, radiation-related injury in the case of prolonged fluoroscopy use, emergency cardiac surgery, and death. The patient understands the risks of serious complication is 1-2 in 1000 with diagnostic cardiac cath and 1-2% or less with angioplasty/stenting.  On  ASA, carvedilol, atorvastatin 80 mg.  Acute systolic heart failure: secondary to #1. Continue coreg. Add entresto post-cath. Assess LVEDP at cath today. Encephalopathy: improved/resolved Anemia: suspect erroneous lab. Hgb now stabilized at 13 mg/dL range AKI: resolved Suspected post MI pericarditis: Patient's symptoms this morning are pretty typical for pericarditis.  Await cardiac cath results today before initiating therapy.  Therapeutic treatment options are limited in the setting of acute myocardial infarction.  We will go ahead and add colchicine.    For questions or updates, please contact [facility] Please consult [email or web address] for contact info under   Signed, Tonny Bollman, MD  11/09/2020,  9:35 AM

## 2020-11-09 NOTE — Progress Notes (Signed)
Pharmacy Electrolyte Replacement  Recent Labs:  Recent Labs    11/08/20 1854 11/09/20 0125 11/09/20 0551  K 3.7 5.3* 3.5  MG 2.0  --   --   PHOS 2.4*  --   --   CREATININE 1.07 1.01  --     Low Critical Values (K </= 2.5, Phos </= 1, Mg </= 1) Present: None  MD Contacted: n/a per protocol - no critical labs   Plan: KCL 40 mEq po x1 dose per protocol.   Link Snuffer, PharmD, BCPS, BCCCP Clinical Pharmacist Please refer to [facility] for [facility] numbers 11/09/2020, 7:09 AM

## 2020-11-09 NOTE — Progress Notes (Signed)
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 He presented for altered mental status. UDS positive for amphetamines, opiates and marijuana. He had complaint of chest pain with troponin of 29 which was evaluated by cardiology and thought to be secondary to vasospasm. No intervention at the time. PCCM consulted for agitation, positive cardiac enzymes with lactic acidosis and renal insufficiency. Patient admitted to ICU for precedex gtt for agitation and management of other issues.    Pertinent  Medical History  History reviewed. No pertinent past medical history.  Significant Hospital Events: Including procedures, antibiotic start and stop dates in addition to other pertinent events   11/08/19 admitted to ICU for agitation requiring precedex gtt + STEMI attributed to coronary vasospasm  6/29 troponin elevated to 13k > 20k, EKG with ST elevation in II, III, avF and V2-V6 patient started on heparin gtt, weaned off precedex gtt   Interim History / Subjective:  Patient had chest pain yesterday afternoon for which he was given sublingual nitro and morphine. No other overnight events. Patient is off precedex gtt. Hemodynamically stable. Scheduled for cath at noon.   Objective   Blood pressure (!) 163/90, pulse 91, temperature 98.2 F (36.8 C), temperature source Oral, resp. rate (!) 21, height 6\' 4"  (1.93 m), weight (!) 155.7 kg, SpO2 99 %.        Intake/Output Summary (Last 24 hours) at 11/09/2020 0736 Last data filed at 11/09/2020 11/11/2020 Gross per 24 hour  Intake 1937.79 ml  Output 2150 ml  Net -212.21 ml   Filed Weights   11/07/20 1400 11/08/20 0500 11/09/20 0244  Weight: (!) 155 kg (!) 156.3 kg (!) 155.7 kg    Examination: General:  morbidly obese male, no acute distress, resting comfortably in bed HENT: [place]/AT, MMM, EOMI Lungs: CTAB, on room air  Cardiovascular: RRR, S1 and S2 present Abdomen: obese, soft, nontender, +BS Extremities: warm/dry Neuro: Aox4, no apparent focal deficits   Resolved Hospital Problem list   Acute encephalopathy  Acute normocytic anemia   Assessment & Plan:  STEMI 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 Serial troponins trending up 23>377>13,566>20,555>22,828. EKG with elevations in II, III, aVF and V2-6. Echo with LVEF 30-35% with akinetic apex and hypokinetic anterolateral and inferoseptal segment and mid anterior and inferior segments. Scheduled for cath today.  - Cardiology following, appreciate recommendations - IV heparin gtt  - Continue aspirin 325mg  daily and lipitor 80mg  daily  - IV morphine 2mg  q2h prn  - Nitrostat SL 0.4mg  q79min prn   Acute renal failure. Improved  Lactic acidosis, improving  Question whether patient had hypotensive episode while in holding cell of jail when he became minimally responsive with significant diaphoresis prior to EMS arrival. Renal function normalized on BMP this morning. Lactic acidosis also improving.  - Trend renal function - Trend lactic acidosis, fluid resuscitation prn although would be cautious in setting of reduced EF as noted above   Transaminitis  Likely 2/2 shock liver in setting of possible transient hypotension; RUQ with fatty infiltration vs hepatocellular disease. Does have mild increase in bilirubin this morning.  - Trend CMP  - Hepatitis panel   Acute encephalopathy 2/2 toxic chemical ingestion, resolved  UDS +ve for amphetamines, opiates, and THC. Salicylate and acetaminophen levels wnl. Weaned off precedex gtt.  Currently resting comfortably in bed. No physical restraints. Does not appear to be in acute withdrawal at this time.  - Ativan q4h prn for agitation   Best  Practice (right click and "Reselect all SmartList Selections" daily)   Diet/type: NPO w/ oral meds DVT prophylaxis: systemic heparin GI prophylaxis: PPI Lines: N/A Foley:  N/A Code Status:  full code Last date of multidisciplinary goals of care discussion [patient updated at bedside 6/30 no family listed, under supervision of GPD]  Patient does not require ICU level care at this time. He is to be transferred to Progressive floor. TRH to assume care 7/1.  Critical care time: 32 minutes    Eliezer Bottom, MD Internal Medicine, PGY-2 11/09/20 7:36 AM Pager # [phone]

## 2020-11-09 NOTE — Progress Notes (Signed)
Progress Note  Patient Name: Ricky Berg Date of Encounter: 11/09/2020  [facility] Cardiologist: None   Subjective   Complaining of chest pain this morning.  Mostly pleuritic type pain with deep breathing and worse with lying down.  No shortness of breath.  Prepared for cardiac catheterization.  Questions are answered this morning.  Inpatient Medications    Scheduled Meds:  aspirin EC  81 mg Oral Daily   atorvastatin  80 mg Oral Daily   carvedilol  3.125 mg Oral BID WC   Chlorhexidine Gluconate Cloth  6 each Topical Daily   folic acid  1 mg Oral Daily   pantoprazole  40 mg Oral QHS   sodium chloride flush  3 mL Intravenous Q12H   thiamine injection  100 mg Intravenous Daily   Continuous Infusions:  sodium chloride     sodium chloride 50 mL/hr at 11/09/20 0609   dexmedetomidine (PRECEDEX) IV infusion Stopped (11/08/20 1559)   heparin 2,150 Units/hr (11/09/20 0550)   PRN Meds: sodium chloride, docusate sodium, LORazepam, morphine injection, nitroGLYCERIN, polyethylene glycol, sodium chloride flush   Vital Signs    Vitals:   11/09/20 0500 11/09/20 0600 11/09/20 0700 11/09/20 0832  BP: (!) 172/82 (!) 163/90  135/81  Pulse: (!) 102 91  100  Resp: (!) 21 (!) 21    Temp:   99.3 F (37.4 C)   TempSrc:   Oral   SpO2: 98% 99%    Weight:      Height:        Intake/Output Summary (Last 24 hours) at 11/09/2020 0935 Last data filed at 11/09/2020 0800 Gross per 24 hour  Intake 1413.59 ml  Output 2525 ml  Net -1111.41 ml   Last 3 Weights 11/09/2020 11/08/2020 11/07/2020  Weight (lbs) 343 lb 4.1 oz 344 lb 9.3 oz 341 lb 11.4 oz  Weight (kg) 155.7 kg 156.3 kg 155 kg      Telemetry    Sinus rhythm/sinus tachycardia- Personally Reviewed   Physical Exam  Alert, oriented, obese male in NAD GEN: No acute distress.   Neck: No JVD Cardiac: Mildly tachycardic and regular, no murmurs, rubs, or gallops.  Respiratory: Clear to auscultation bilaterally. GI: Soft, nontender,  non-distended  MS: No edema; No deformity. Neuro:  Nonfocal  Psych: Normal affect   Labs    High Sensitivity Troponin:   Recent Labs  Lab 11/07/20 0903 11/07/20 1144 11/07/20 2332 11/08/20 0536 11/08/20 1854  TROPONINIHS 23* 377* 13,566* 20,555* 22,828*      Chemistry Recent Labs  Lab 11/07/20 2332 11/08/20 1854 11/09/20 0125 11/09/20 0551  NA 138 133* 131*  --   K 3.7 3.7 5.3* 3.5  CL 107 99 99  --   CO2 23 24 24   --   GLUCOSE 143* 72 120*  --   BUN 29* 18 14  --   CREATININE 1.57* 1.07 1.01  --   CALCIUM 8.4* 8.7* 8.5*  --   PROT 6.8 7.5 7.6  --   ALBUMIN 3.3* 3.6 3.7  --   AST 189* 236* 225*  --   ALT 61* 80* 88*  --   ALKPHOS 38 50 50  --   BILITOT 0.6 0.9 1.9*  --   GFRNONAA 60* >60 >60  --   ANIONGAP 8 10 8   --      Hematology Recent Labs  Lab 11/07/20 2332 11/08/20 1854 11/09/20 0125  WBC 8.6 5.8 7.8  RBC 3.17* 4.95 5.12  HGB 8.6* 13.2 13.6  HCT 27.6* 41.3 42.3  MCV 87.1 83.4 82.6  MCH 27.1 26.7 26.6  MCHC 31.2 32.0 32.2  RDW 13.9 13.3 13.4  PLT 333 206 202    BNPNo results for input(s): BNP, PROBNP in the last 168 hours.   DDimer No results for input(s): DDIMER in the last 168 hours.   Radiology    EEG adult  Result Date: 11/08/2020 Charlsie QuestYadav, Priyanka O, MD     11/08/2020  8:53 AM Patient Name: Ricky Berg MRN: [id number] Epilepsy Attending: Charlsie QuestPriyanka O Yadav Referring Physician/Provider: Dr Levon Hedgeraniel Smith Date: 11/07/2020 Duration: 23.24 mins Patient history: 35 year old male with altered mental status.  EEG to evaluate for seizures. Level of alertness: Awake, asleep AEDs during EEG study: None Technical aspects: This EEG study was done with scalp electrodes positioned according to the 10-20 International system of electrode placement. Electrical activity was acquired at a sampling rate of 500Hz  and reviewed with a high frequency filter of 70Hz  and a low frequency filter of 1Hz . EEG data were recorded continuously and digitally stored. Description:  The posterior dominant rhythm consists of 10 Hz activity of moderate voltage (25-35 uV) seen predominantly in posterior head regions, symmetric and reactive to eye opening and eye closing. Sleep was characterized by vertex waves, sleep spindles (12 to 14 Hz), maximal frontocentral region. Hyperventilation and photic stimulation were not performed.   IMPRESSION: This study is within normal limits. No seizures or epileptiform discharges were seen throughout the recording. Charlsie Questriyanka O Yadav   ECHOCARDIOGRAM COMPLETE  Result Date: 11/08/2020    ECHOCARDIOGRAM REPORT   Patient Name:   Ricky Berg Date of Exam: 11/08/2020 Medical Rec #:  [id number]    Height:       76.0 in Accession #:    [id number]   Weight:       344.6 lb Date of Birth:  12/25/1987    BSA:          2.791 m Patient Age:    35 years     BP:           160/59 mmHg Patient Gender: M            HR:           65 bpm. Exam Location:  Inpatient Procedure: 2D Echo, Cardiac Doppler and Color Doppler Indications:    Acute myocardial infarction  History:        Patient has no prior history of Echocardiogram examinations.  Sonographer:    Shirlean KellyJohn Mendel Brown Referring Phys: [id number] Migdalia DkKORONKWO U OGAN IMPRESSIONS  1. Left ventricular ejection fraction, by estimation, is 30 to 35%. The left ventricle has moderately decreased function. The left ventricle demonstrates regional wall motion abnormalities (see scoring diagram/findings for description). Left ventricular  diastolic parameters were normal.  2. Right ventricular systolic function is normal. The right ventricular size is normal. Tricuspid regurgitation signal is inadequate for assessing PA pressure.  3. The mitral valve is normal in structure. No evidence of mitral valve regurgitation. No evidence of mitral stenosis.  4. The aortic valve is normal in structure. Aortic valve regurgitation is trivial. No aortic stenosis is present. Conclusion(s)/Recommendation(s): No left ventricular mural or apical  thrombus/thrombi. FINDINGS  Left Ventricle: Left ventricular ejection fraction, by estimation, is 30 to 35%. The left ventricle has moderately decreased function. The left ventricle demonstrates regional wall motion abnormalities. The left ventricular internal cavity size was normal in size. There is no left ventricular hypertrophy. Left ventricular diastolic parameters were normal.  LV Wall Scoring:  The apical lateral segment, apical septal segment, apical anterior segment, and apical inferior segment are akinetic. The mid anteroseptal segment, mid anterolateral segment, mid inferoseptal segment, mid anterior segment, and mid inferior segment are hypokinetic. Right Ventricle: The right ventricular size is normal. No increase in right ventricular wall thickness. Right ventricular systolic function is normal. Tricuspid regurgitation signal is inadequate for assessing PA pressure. Left Atrium: Left atrial size was normal in size. Right Atrium: Right atrial size was normal in size. Pericardium: There is no evidence of pericardial effusion. Mitral Valve: The mitral valve is normal in structure. No evidence of mitral valve regurgitation. No evidence of mitral valve stenosis. Tricuspid Valve: The tricuspid valve is normal in structure. Tricuspid valve regurgitation is trivial. No evidence of tricuspid stenosis. Aortic Valve: The aortic valve is normal in structure. Aortic valve regurgitation is trivial. No aortic stenosis is present. Aortic valve mean gradient measures 2.0 mmHg. Aortic valve peak gradient measures 3.3 mmHg. Aortic valve area, by VTI measures 3.64 cm. Pulmonic Valve: The pulmonic valve was normal in structure. Pulmonic valve regurgitation is trivial. No evidence of pulmonic stenosis. Aorta: The aortic root is normal in size and structure. Venous: The inferior vena cava was not well visualized. IAS/Shunts: The interatrial septum was not well visualized.  LEFT VENTRICLE PLAX 2D LVIDd:         4.60 cm   Diastology LVIDs:         3.00 cm  LV e' medial:    7.94 cm/s LV PW:         1.00 cm  LV E/e' medial:  10.8 LV IVS:        1.00 cm  LV e' lateral:   7.40 cm/s LVOT diam:     2.30 cm  LV E/e' lateral: 11.6 LV SV:         73 LV SV Index:   26 LVOT Area:     4.15 cm  RIGHT VENTRICLE RV S prime:     6.85 cm/s TAPSE (M-mode): 1.6 cm LEFT ATRIUM             Index       RIGHT ATRIUM           Index LA diam:        4.20 cm 1.50 cm/m  RA Area:     18.00 cm LA Vol (A2C):   61.1 ml 21.89 ml/m RA Volume:   57.40 ml  20.56 ml/m LA Vol (A4C):   54.9 ml 19.67 ml/m LA Biplane Vol: 58.4 ml 20.92 ml/m  AORTIC VALVE AV Area (Vmax):    3.81 cm AV Area (Vmean):   3.92 cm AV Area (VTI):     3.64 cm AV Vmax:           90.50 cm/s AV Vmean:          59.850 cm/s AV VTI:            0.200 m AV Peak Grad:      3.3 mmHg AV Mean Grad:      2.0 mmHg LVOT Vmax:         83.00 cm/s LVOT Vmean:        56.500 cm/s LVOT VTI:          0.175 m LVOT/AV VTI ratio: 0.88  AORTA Ao Root diam: 3.20 cm Ao Asc diam:  2.90 cm MITRAL VALVE MV Area (PHT): 4.46 cm    SHUNTS MV Decel Time: 170 msec    Systemic  VTI:  0.18 m MV E velocity: 85.50 cm/s  Systemic Diam: 2.30 cm MV A velocity: 83.60 cm/s MV E/A ratio:  1.02 Weston Brass MD Electronically signed by Weston Brass MD Signature Date/Time: 11/08/2020/9:55:27 AM    Final    [place] EKG SITE RITE  Result Date: 11/08/2020 If Site Rite image not attached, placement could not be confirmed due to current cardiac rhythm.  US Abdomen Limited RUQ (LIVER/GB)  Result Date: 11/09/2020 CLINICAL DATA:  Transaminitis. EXAM: ULTRASOUND ABDOMEN LIMITED RIGHT UPPER QUADRANT COMPARISON:  No prior. FINDINGS: Gallbladder: No gallstones or wall thickening visualized. No sonographic Murphy sign noted by sonographer. Common bile duct: Diameter: 4.4 Liver: Increased echogenicity consistent fatty infiltration or hepatocellular disease. Portal vein is patent on color Doppler imaging with normal direction of blood flow  towards the liver. Other: Suboptimal exam due to patient's body habitus. IMPRESSION: 1. Suboptimal exam due to patient's body habitus. No gallstones or biliary distention. 2. Increased hepatic echogenicity consistent fatty infiltration or hepatocellular disease. Electronically Signed   By: Maisie Fus  Register   On: 11/09/2020 07:22    Cardiac Studies   Echo: FINDINGS   Left Ventricle: Left ventricular ejection fraction, by estimation, is 30  to 35%. The left ventricle has moderately decreased function. The left  ventricle demonstrates regional wall motion abnormalities. The left  ventricular internal cavity size was  normal in size. There is no left ventricular hypertrophy. Left ventricular  diastolic parameters were normal.      LV Wall Scoring:  The apical lateral segment, apical septal segment, apical anterior  segment,  and apical inferior segment are akinetic. The mid anteroseptal segment,  mid  anterolateral segment, mid inferoseptal segment, mid anterior segment, and  mid inferior segment are hypokinetic.   Right Ventricle: The right ventricular size is normal. No increase in  right ventricular wall thickness. Right ventricular systolic function is  normal. Tricuspid regurgitation signal is inadequate for assessing PA  pressure.   Left Atrium: Left atrial size was normal in size.   Right Atrium: Right atrial size was normal in size.   Pericardium: There is no evidence of pericardial effusion.   Mitral Valve: The mitral valve is normal in structure. No evidence of  mitral valve regurgitation. No evidence of mitral valve stenosis.   Tricuspid Valve: The tricuspid valve is normal in structure. Tricuspid  valve regurgitation is trivial. No evidence of tricuspid stenosis.   Aortic Valve: The aortic valve is normal in structure. Aortic valve  regurgitation is trivial. No aortic stenosis is present. Aortic valve mean  gradient measures 2.0 mmHg. Aortic valve peak gradient  measures 3.3 mmHg.  Aortic valve area, by VTI measures  3.64 cm.   Pulmonic Valve: The pulmonic valve was normal in structure. Pulmonic valve  regurgitation is trivial. No evidence of pulmonic stenosis.   Aorta: The aortic root is normal in size and structure.   Venous: The inferior vena cava was not well visualized.   IAS/Shunts: The interatrial septum was not well visualized.   Patient Profile     35 y.o. male with polysubstance abuse presents with anterior MI, complicated presentation with acute agitation, drug overdose, acute kidney injury, anemia  Assessment & Plan    Anterior MI: on heparin, severe LV dysfunction in pattern of LAD territory infarction. Cath today. I have reviewed the risks, indications, and alternatives to cardiac catheterization, possible angioplasty, and stenting with the patient. Risks include but are not limited to bleeding, infection, vascular injury, stroke, myocardial infection, arrhythmia, kidney  injury, radiation-related injury in the case of prolonged fluoroscopy use, emergency cardiac surgery, and death. The patient understands the risks of serious complication is 1-2 in 1000 with diagnostic cardiac cath and 1-2% or less with angioplasty/stenting.  On  ASA, carvedilol, atorvastatin 80 mg.  Acute systolic heart failure: secondary to #1. Continue coreg. Add entresto post-cath. Assess LVEDP at cath today. Encephalopathy: improved/resolved Anemia: suspect erroneous lab. Hgb now stabilized at 13 mg/dL range AKI: resolved Suspected post MI pericarditis: Patient's symptoms this morning are pretty typical for pericarditis.  Await cardiac cath results today before initiating therapy.  Therapeutic treatment options are limited in the setting of acute myocardial infarction.  We will go ahead and add colchicine.    For questions or updates, please contact [facility] Please consult [email or web address] for contact info under   Signed, Shawntay Prest, MD  11/09/2020,  9:35 AM    

## 2020-11-09 NOTE — Progress Notes (Addendum)
Pt arrived to rm 26 from cath lab with TR band. Initiated tele, CHG wipe given. Oriented to the unit. Call bell within reach .  Pt admitted with ST elevation and chest pain of 5. Pt denies numbness and tingling sension on the arm or chest. Obtained EKG, administered pt ntiro 0.4 mg x2. Notified MD and cardilolgist.  Will continue to monitor the pt.    Lawson Radar, RN

## 2020-11-09 NOTE — Progress Notes (Signed)
Peripherally Inserted Central Catheter Placement  The IV Nurse has discussed with the patient and/or persons authorized to consent for the patient, the purpose of this procedure and the potential benefits and risks involved with this procedure.  The benefits include less needle sticks, lab draws from the catheter, and the patient may be discharged home with the catheter. Risks include, but not limited to, infection, bleeding, blood clot (thrombus formation), and puncture of an artery; nerve damage and irregular heartbeat and possibility to perform a PICC exchange if needed/ordered by physician.  Alternatives to this procedure were also discussed.  Bard Power PICC patient education guide, fact sheet on infection prevention and patient information card has been provided to patient /or left at bedside.    PICC Placement Documentation  PICC Double Lumen 11/09/20 PICC Right Brachial 43 cm 1 cm (Active)  Exposed Catheter (cm) 1 cm 11/09/20 1003  Site Assessment Clean;Dry 11/09/20 1003  Lumen #1 Status Flushed;Saline locked;Blood return noted 11/09/20 1003  Lumen #2 Status Saline locked;Flushed;Blood return noted 11/09/20 1003  Dressing Type Transparent 11/09/20 1003  Safety Lock Not Applicable 11/09/20 1003  Dressing Change Due 11/16/20 11/09/20 1003       Romie Jumper 11/09/2020, 10:06 AM

## 2020-11-10 ENCOUNTER — Encounter (HOSPITAL_COMMUNITY): Payer: Self-pay | Admitting: Cardiology

## 2020-11-10 LAB — CBC
HCT: 38 % — ABNORMAL LOW (ref 39.0–52.0)
Hemoglobin: 12.3 g/dL — ABNORMAL LOW (ref 13.0–17.0)
MCH: 26.7 pg (ref 26.0–34.0)
MCHC: 32.4 g/dL (ref 30.0–36.0)
MCV: 82.6 fL (ref 80.0–100.0)
Platelets: 251 10*3/uL (ref 150–400)
RBC: 4.6 MIL/uL (ref 4.22–5.81)
RDW: 13.5 % (ref 11.5–15.5)
WBC: 9.9 10*3/uL (ref 4.0–10.5)
nRBC: 0 % (ref 0.0–0.2)

## 2020-11-10 LAB — COMPREHENSIVE METABOLIC PANEL
ALT: 63 U/L — ABNORMAL HIGH (ref 0–44)
AST: 89 U/L — ABNORMAL HIGH (ref 15–41)
Albumin: 3.1 g/dL — ABNORMAL LOW (ref 3.5–5.0)
Alkaline Phosphatase: 56 U/L (ref 38–126)
Anion gap: 8 (ref 5–15)
BUN: 7 mg/dL (ref 6–20)
CO2: 27 mmol/L (ref 22–32)
Calcium: 8.5 mg/dL — ABNORMAL LOW (ref 8.9–10.3)
Chloride: 98 mmol/L (ref 98–111)
Creatinine, Ser: 0.95 mg/dL (ref 0.61–1.24)
GFR, Estimated: >60 mL/min (ref >60)
Glucose, Bld: 118 mg/dL — ABNORMAL HIGH (ref 70–99)
Potassium: 3.4 mmol/L — ABNORMAL LOW (ref 3.5–5.1)
Sodium: 133 mmol/L — ABNORMAL LOW (ref 135–145)
Total Bilirubin: 1.1 mg/dL (ref 0.3–1.2)
Total Protein: 7.2 g/dL (ref 6.5–8.1)

## 2020-11-10 LAB — MAGNESIUM: Magnesium: 2.1 mg/dL (ref 1.7–2.4)

## 2020-11-10 LAB — PHOSPHORUS: Phosphorus: 2.6 mg/dL (ref 2.5–4.6)

## 2020-11-10 MED ORDER — LOSARTAN POTASSIUM 25 MG PO TABS
25.0000 mg | ORAL_TABLET | Freq: Every day | ORAL | Status: DC
  Administered 2020-11-10 – 2020-11-11 (×2): 25 mg via ORAL
  Filled 2020-11-10 (×2): qty 1

## 2020-11-10 MED ORDER — POTASSIUM CHLORIDE CRYS ER 20 MEQ PO TBCR
40.0000 meq | EXTENDED_RELEASE_TABLET | Freq: Once | ORAL | Status: AC
  Administered 2020-11-10: 40 meq via ORAL
  Filled 2020-11-10: qty 2

## 2020-11-10 MED ORDER — CARVEDILOL 6.25 MG PO TABS
6.2500 mg | ORAL_TABLET | Freq: Two times a day (BID) | ORAL | Status: DC
  Administered 2020-11-10 – 2020-11-11 (×3): 6.25 mg via ORAL
  Filled 2020-11-10 (×3): qty 1

## 2020-11-10 NOTE — Progress Notes (Signed)
CARDIAC REHAB PHASE I   PRE:  Rate/Rhythm: 102 ST  BP:  Supine:   Sitting: 108/49  Standing:    SaO2: 97%RA  MODE:  Ambulation: door ft   POST:  Rate/Rhythm: 119 ST  BP:  Supine:   Sitting: 117/73  Standing:    SaO2: 99%RA 1250-1400 Put telemetry leads on and attempted to walk pt. He stated he does not have his glasses and everything is blurred. Between ankle restraint and blurred vision, pt dizzy and staggered. Assisted to bathroom and helped pt get cleaned and then to bed.  Gave pt MI and CHF materials. Reviewed signs/symptoms of CHF since he has low EF. Pt does not have scales at home so cannot weigh daily but discussed signs of swelling of feet, ankles, belly and hands, SOB, dry hacking cough, etc. Discussed low sodium restriction and gave diets. Discussed MI restriction, walking for exercise when steady, NTG use, and CRP 2. Pt lives in [place] so will refer to [facility] CRP 2 which he stated is closest. Pt voiced understanding of ed and materials left in room.   Luetta Nutting, RN BSN  11/10/2020 1:51 PM

## 2020-11-10 NOTE — Progress Notes (Signed)
PROGRESS NOTE    Ricky CurbKenneth Berg  ZOX:[id number] DOB: June 11, 1985 DOA: 11/07/2020 PCP: Pcp, No    Brief Narrative:  35yo presents from jail with AMS and chest pain after being processed in jail for polysubstance abuse.  Assessment & Plan:   Active Problems:   AMS (altered mental status)   ST elevation myocardial infarction involving left anterior descending (LAD) coronary artery (HCC)   STEMI S/p heart cath 6/30 with findings of single vessel disease Recommendation for medical management Appreciate Cardiology assistance ARF Suspect secondary to polysubstance abuse Renal function has normalized Transaminitis Likely secondary to polysubstance abuse LFT's trending down Cont to follow LFT trends Acute toxic metabolic encephalopathy Resolved Conversing normally  DVT prophylaxis: Heparin subq Code Status: Full Family Communication: Pt in room, family not at bedside  Status is: Inpatient  Remains inpatient appropriate because:Inpatient level of care appropriate due to severity of illness  Dispo: The patient is from:  jail              [facility]              Patient currently is not medically stable to d/c.   Difficult to place patient No       Consultants:  Cardiology PCCM  Procedures:  Heart cath 6/30  Antimicrobials: Anti-infectives (From admission, onward)    None       Subjective: Without complaints  Objective: Vitals:   11/10/20 0612 11/10/20 0623 11/10/20 0844 11/10/20 1127  BP:  128/77 123/63 (!) 105/57  Pulse: 92 100 (!) 108 99  Resp: 20 16 20 20   Temp:  99.4 F (37.4 C) 99 F (37.2 C) 99 F (37.2 C)  TempSrc:  Oral Oral Oral  SpO2: 98% 97% 100% 95%  Weight: (!) 164 kg     Height:        Intake/Output Summary (Last 24 hours) at 11/10/2020 1555 Last data filed at 11/10/2020 [id number] Gross per 24 hour  Intake 362.58 ml  Output 1200 ml  Net -837.42 ml   Filed Weights   11/08/20 0500 11/09/20 0244 11/10/20 0612  Weight:  (!) 156.3 kg (!) 155.7 kg (!) 164 kg    Examination: General exam: Awake, laying in bed, in nad Respiratory system: Normal respiratory effort, no wheezing Cardiovascular system: regular rate, s1, s2 Gastrointestinal system: Soft, nondistended, positive BS Central nervous system: CN2-12 grossly intact, strength intact Extremities: Perfused, no clubbing Skin: Normal skin turgor, no notable skin lesions seen Psychiatry: Mood normal // no visual hallucinations   Data Reviewed: I have personally reviewed following labs and imaging studies  CBC: Recent Labs  Lab 11/07/20 0903 11/07/20 1128 11/07/20 2332 11/08/20 1854 11/09/20 0125 11/10/20 0500  WBC 10.3  --  8.6 5.8 7.8 9.9  NEUTROABS 7.4  --   --   --   --   --   HGB 17.2* 15.3 8.6* 13.2 13.6 12.3*  HCT 52.4* 45.0 27.6* 41.3 42.3 38.0*  MCV 82.8  --  87.1 83.4 82.6 82.6  PLT 362  --  333 206 202 251   Basic Metabolic Panel: Recent Labs  Lab 11/07/20 0903 11/07/20 1128 11/07/20 2332 11/08/20 1854 11/09/20 0125 11/09/20 0551 11/10/20 0500  NA 139 144 138 133* 131*  --  133*  K 4.3 3.6 3.7 3.7 5.3* 3.5 3.4*  CL 101  --  107 99 99  --  98  CO2 21*  --  23 24 24   --  27  GLUCOSE 125*  --  143* 72 120*  --  118*  BUN 32*  --  29* 18 14  --  7  CREATININE 3.08*  --  1.57* 1.07 1.01  --  0.95  CALCIUM 10.6*  --  8.4* 8.7* 8.5*  --  8.5*  MG  --   --  2.2 2.0  --   --  2.1  PHOS  --   --  3.5 2.4*  --   --  2.6   GFR: Estimated Creatinine Clearance: 182.4 mL/min (by C-G formula based on SCr of 0.95 mg/dL). Liver Function Tests: Recent Labs  Lab 11/07/20 0903 11/07/20 2332 11/08/20 1854 11/09/20 0125 11/10/20 0500  AST 136* 189* 236* 225* 89*  ALT 79* 61* 80* 88* 63*  ALKPHOS 62 38 50 50 56  BILITOT 1.4* 0.6 0.9 1.9* 1.1  PROT 10.3* 6.8 7.5 7.6 7.2  ALBUMIN 5.0 3.3* 3.6 3.7 3.1*   Recent Labs  Lab 11/07/20 2342 11/08/20 1854  LIPASE 25 34  AMYLASE 22* 29   Recent Labs  Lab 11/07/20 0924  AMMONIA 27    Coagulation Profile: Recent Labs  Lab 11/07/20 2332 11/08/20 1854  INR 1.2 1.1   Cardiac Enzymes: No results for input(s): CKTOTAL, CKMB, CKMBINDEX, TROPONINI in the last 168 hours. BNP (last 3 results) No results for input(s): PROBNP in the last 8760 hours. HbA1C: No results for input(s): HGBA1C in the last 72 hours. CBG: Recent Labs  Lab 11/07/20 1535  GLUCAP 140*   Lipid Profile: Recent Labs    11/09/20 0125  CHOL 132  HDL 28*  LDLCALC 84  TRIG 676  CHOLHDL 4.7   Thyroid Function Tests: No results for input(s): TSH, T4TOTAL, FREET4, T3FREE, THYROIDAB in the last 72 hours. Anemia Panel: No results for input(s): VITAMINB12, FOLATE, FERRITIN, TIBC, IRON, RETICCTPCT in the last 72 hours. Sepsis Labs: Recent Labs  Lab 11/07/20 1950 11/07/20 2342 11/08/20 1854  PROCALCITON  --  0.16 0.12  LATICACIDVEN 3.6* 4.0* 3.0*    Recent Results (from the past 240 hour(s))  Resp Panel by RT-PCR (Flu A&B, Covid) Nasopharyngeal Swab     Status: None   Collection Time: 11/07/20  8:49 AM   Specimen: Nasopharyngeal Swab; Nasopharyngeal(NP) swabs in vial transport medium  Result Value Ref Range Status   SARS Coronavirus 2 by RT PCR NEGATIVE NEGATIVE Final    Comment: (NOTE) SARS-CoV-2 target nucleic acids are NOT DETECTED.  The SARS-CoV-2 RNA is generally detectable in upper respiratory specimens during the acute phase of infection. The lowest concentration of SARS-CoV-2 viral copies this assay can detect is 138 copies/mL. A negative result does not preclude SARS-Cov-2 infection and should not be used as the sole basis for treatment or other patient management decisions. A negative result may occur with  improper specimen collection/handling, submission of specimen other than nasopharyngeal swab, presence of viral mutation(s) within the areas targeted by this assay, and inadequate number of viral copies(<138 copies/mL). A negative result must be combined with clinical  observations, patient history, and epidemiological information. The expected result is Negative.  Fact Sheet for Patients:  BloggerCourse.com  Fact Sheet for Healthcare Providers:  SeriousBroker.it  This test is no t yet approved or cleared by the [place] FDA and  has been authorized for detection and/or diagnosis of SARS-CoV-2 by FDA under an Emergency Use Authorization (EUA). This EUA will remain  in effect (meaning this test can be used) for the duration of the COVID-19 declaration under Section 564(b)(1) of the Act, 21  U.S.C.section 360bbb-3(b)(1), unless the authorization is terminated  or revoked sooner.       Influenza A by PCR NEGATIVE NEGATIVE Final   Influenza B by PCR NEGATIVE NEGATIVE Final    Comment: (NOTE) The Xpert Xpress SARS-CoV-2/FLU/RSV plus assay is intended as an aid in the diagnosis of influenza from Nasopharyngeal swab specimens and should not be used as a sole basis for treatment. Nasal washings and aspirates are unacceptable for Xpert Xpress SARS-CoV-2/FLU/RSV testing.  Fact Sheet for Patients: BloggerCourse.com  Fact Sheet for Healthcare Providers: SeriousBroker.it  This test is not yet approved or cleared by the [place] FDA and has been authorized for detection and/or diagnosis of SARS-CoV-2 by FDA under an Emergency Use Authorization (EUA). This EUA will remain in effect (meaning this test can be used) for the duration of the COVID-19 declaration under Section 564(b)(1) of the Act, 21 U.S.C. section 360bbb-3(b)(1), unless the authorization is terminated or revoked.  Performed at [facility], [address] [place], [place] [address]   SARS CORONAVIRUS 2 (TAT 6-24 HRS) Nasopharyngeal Nasopharyngeal Swab     Status: None   Collection Time: 11/07/20  1:10 PM   Specimen: Nasopharyngeal Swab  Result Value Ref Range Status   SARS  Coronavirus 2 NEGATIVE NEGATIVE Final    Comment: (NOTE) SARS-CoV-2 target nucleic acids are NOT DETECTED.  The SARS-CoV-2 RNA is generally detectable in upper and lower respiratory specimens during the acute phase of infection. Negative results do not preclude SARS-CoV-2 infection, do not rule out co-infections with other pathogens, and should not be used as the sole basis for treatment or other patient management decisions. Negative results must be combined with clinical observations, patient history, and epidemiological information. The expected result is Negative.  Fact Sheet for Patients: HairSlick.no  Fact Sheet for Healthcare Providers: quierodirigir.com  This test is not yet approved or cleared by the [place] FDA and  has been authorized for detection and/or diagnosis of SARS-CoV-2 by FDA under an Emergency Use Authorization (EUA). This EUA will remain  in effect (meaning this test can be used) for the duration of the COVID-19 declaration under Se ction 564(b)(1) of the Act, 21 U.S.C. section 360bbb-3(b)(1), unless the authorization is terminated or revoked sooner.  Performed at [facility], [address] [place], [place] [address]   MRSA Next Gen by PCR, Nasal     Status: None   Collection Time: 11/07/20  2:17 PM  Result Value Ref Range Status   MRSA by PCR Next Gen NOT DETECTED NOT DETECTED Final    Comment: (NOTE) The GeneXpert MRSA Assay (FDA approved for NASAL specimens only), is one component of a comprehensive MRSA colonization surveillance program. It is not intended to diagnose MRSA infection nor to guide or monitor treatment for MRSA infections. Test performance is not FDA approved in patients less than 7 years old. Performed at [facility], [address], [place], [place] [address]   Culture, blood (routine x 2)     Status: None (Preliminary result)   Collection Time: 11/07/20   3:44 PM   Specimen: BLOOD  Result Value Ref Range Status   Specimen Description BLOOD RIGHT FOOT  Final   Special Requests   Final    BOTTLES DRAWN AEROBIC ONLY Blood Culture adequate volume   Culture   Final    NO GROWTH 3 DAYS Performed at [facility], [address] [place], [place] [address]    Report Status PENDING  Incomplete  Culture, blood (Routine X 2) w Reflex to ID Panel     Status: None (Preliminary result)   Collection Time: 11/08/20  6:36 PM   Specimen: BLOOD LEFT HAND  Result Value Ref Range Status   Specimen Description BLOOD LEFT HAND  Final   Special Requests   Final    BOTTLES DRAWN AEROBIC ONLY Blood Culture results may not be optimal due to an inadequate volume of blood received in culture bottles   Culture   Final    NO GROWTH 2 DAYS Performed at [facility], [address], [place], [place] [address]    Report Status PENDING  Incomplete     Radiology Studies: CARDIAC CATHETERIZATION  Result Date: 11/09/2020  Mid LAD lesion is 100% stenosed.  LV end diastolic pressure is severely elevated.  1. 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 Elevated LVEDP of 34 mm Hg Plan: medical management. Will give IV lasix 40 mg x 1   US Abdomen Limited RUQ (LIVER/GB)  Result Date: 11/09/2020 CLINICAL DATA:  Transaminitis. EXAM: ULTRASOUND ABDOMEN LIMITED RIGHT UPPER QUADRANT COMPARISON:  No prior. FINDINGS: Gallbladder: No gallstones or wall thickening visualized. No sonographic Murphy sign noted by sonographer. Common bile duct: Diameter: 4.4 Liver: Increased echogenicity consistent fatty infiltration or hepatocellular disease. Portal vein is patent on color Doppler imaging with normal direction of blood flow towards the liver. Other: Suboptimal exam due to patient's body habitus. IMPRESSION: 1. Suboptimal exam due to patient's body habitus. No gallstones or biliary distention. 2. Increased hepatic echogenicity consistent fatty infiltration or  hepatocellular disease. Electronically Signed   By: Maisie Fus  Register   On: 11/09/2020 07:22    Scheduled Meds:  aspirin EC  81 mg Oral Daily   atorvastatin  80 mg Oral Daily   carvedilol  6.25 mg Oral BID WC   Chlorhexidine Gluconate Cloth  6 each Topical Daily   colchicine  0.6 mg Oral BID   folic acid  1 mg Oral Daily   heparin  5,000 Units Subcutaneous Q8H   isosorbide mononitrate  30 mg Oral Daily   losartan  25 mg Oral Daily   sodium chloride flush  10-40 mL Intracatheter Q12H   sodium chloride flush  3 mL Intravenous Q12H   thiamine  100 mg Oral Daily   Continuous Infusions:  sodium chloride       LOS: 3 days   Rickey Barbara, MD [facility] Pager On Amion  If 7PM-7AM, please contact night-coverage 11/10/2020, 3:55 PM

## 2020-11-10 NOTE — Progress Notes (Addendum)
Progress Note  Patient Name: Ricky Berg Date of Encounter: 11/10/2020  [facility] Cardiologist: None   Subjective   Feeling much better this morning. Chest pain improved.   Inpatient Medications    Scheduled Meds:  aspirin EC  81 mg Oral Daily   atorvastatin  80 mg Oral Daily   carvedilol  3.125 mg Oral BID WC   Chlorhexidine Gluconate Cloth  6 each Topical Daily   colchicine  0.6 mg Oral BID   folic acid  1 mg Oral Daily   heparin  5,000 Units Subcutaneous Q8H   isosorbide mononitrate  30 mg Oral Daily   sodium chloride flush  10-40 mL Intracatheter Q12H   sodium chloride flush  3 mL Intravenous Q12H   thiamine  100 mg Oral Daily   Continuous Infusions:  sodium chloride     PRN Meds: sodium chloride, docusate sodium, LORazepam, morphine injection, nitroGLYCERIN, polyethylene glycol, sodium chloride flush, sodium chloride flush   Vital Signs    Vitals:   11/09/20 2341 11/10/20 0612 11/10/20 0623 11/10/20 0844  BP: 134/79  128/77 123/63  Pulse: 89 92 100 (!) 108  Resp: 20 20 16 20   Temp: 99 F (37.2 C)  99.4 F (37.4 C) 99 F (37.2 C)  TempSrc: Oral  Oral Oral  SpO2: 100% 98% 97% 100%  Weight:  (!) 164 kg    Height:        Intake/Output Summary (Last 24 hours) at 11/10/2020 1036 Last data filed at 11/10/2020 0938 Gross per 24 hour  Intake 362.58 ml  Output 1600 ml  Net -1237.42 ml   Last 3 Weights 11/10/2020 11/09/2020 11/08/2020  Weight (lbs) 361 lb 8.9 oz 343 lb 4.1 oz 344 lb 9.3 oz  Weight (kg) 164 kg 155.7 kg 156.3 kg      Telemetry    ST - Personally Reviewed  ECG    No new tracing this morning.   Physical Exam   GEN: No acute distress.   Neck: No JVD Cardiac: RRR, no murmurs, rubs, or gallops.  Respiratory: Clear to auscultation bilaterally. GI: Soft, nontender, non-distended  MS: No edema; No deformity. Right radial cath site stable.  Neuro:  Nonfocal  Psych: Normal affect   Labs    High Sensitivity Troponin:   Recent Labs  Lab  11/07/20 0903 11/07/20 1144 11/07/20 2332 11/08/20 0536 11/08/20 1854  TROPONINIHS 23* 377* 13,566* 20,555* 22,828*      Chemistry Recent Labs  Lab 11/08/20 1854 11/09/20 0125 11/09/20 0551 11/10/20 0500  NA 133* 131*  --  133*  K 3.7 5.3* 3.5 3.4*  CL 99 99  --  98  CO2 24 24  --  27  GLUCOSE 72 120*  --  118*  BUN 18 14  --  7  CREATININE 1.07 1.01  --  0.95  CALCIUM 8.7* 8.5*  --  8.5*  PROT 7.5 7.6  --  7.2  ALBUMIN 3.6 3.7  --  3.1*  AST 236* 225*  --  89*  ALT 80* 88*  --  63*  ALKPHOS 50 50  --  56  BILITOT 0.9 1.9*  --  1.1  GFRNONAA >60 >60  --  >60  ANIONGAP 10 8  --  8     Hematology Recent Labs  Lab 11/08/20 1854 11/09/20 0125 11/10/20 0500  WBC 5.8 7.8 9.9  RBC 4.95 5.12 4.60  HGB 13.2 13.6 12.3*  HCT 41.3 42.3 38.0*  MCV 83.4 82.6 82.6  MCH 26.7 26.6 26.7  MCHC 32.0 32.2 32.4  RDW 13.3 13.4 13.5  PLT 206 202 251    BNPNo results for input(s): BNP, PROBNP in the last 168 hours.   DDimer No results for input(s): DDIMER in the last 168 hours.   Radiology    CARDIAC CATHETERIZATION  Result Date: 11/09/2020  Mid LAD lesion is 100% stenosed.  LV end diastolic pressure is severely elevated.  1. 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 Elevated LVEDP of 34 mm Hg Plan: medical management. Will give IV lasix 40 mg x 1   [place] EKG SITE RITE  Result Date: 11/08/2020 If Site Rite image not attached, placement could not be confirmed due to current cardiac rhythm.  US Abdomen Limited RUQ (LIVER/GB)  Result Date: 11/09/2020 CLINICAL DATA:  Transaminitis. EXAM: ULTRASOUND ABDOMEN LIMITED RIGHT UPPER QUADRANT COMPARISON:  No prior. FINDINGS: Gallbladder: No gallstones or wall thickening visualized. No sonographic Murphy sign noted by sonographer. Common bile duct: Diameter: 4.4 Liver: Increased echogenicity consistent fatty infiltration or hepatocellular disease. Portal vein is patent on color Doppler imaging with normal direction of blood  flow towards the liver. Other: Suboptimal exam due to patient's body habitus. IMPRESSION: 1. Suboptimal exam due to patient's body habitus. No gallstones or biliary distention. 2. Increased hepatic echogenicity consistent fatty infiltration or hepatocellular disease. Electronically Signed   By: Maisie Fus  Register   On: 11/09/2020 07:22    Cardiac Studies   Echo: 11/08/20  IMPRESSIONS     1. Left ventricular ejection fraction, by estimation, is 30 to 35%. The  left ventricle has moderately decreased function. The left ventricle  demonstrates regional wall motion abnormalities (see scoring  diagram/findings for description). Left ventricular   diastolic parameters were normal.   2. Right ventricular systolic function is normal. The right ventricular  size is normal. Tricuspid regurgitation signal is inadequate for assessing  PA pressure.   3. The mitral valve is normal in structure. No evidence of mitral valve  regurgitation. No evidence of mitral stenosis.   4. The aortic valve is normal in structure. Aortic valve regurgitation is  trivial. No aortic stenosis is present.   Conclusion(s)/Recommendation(s): No left ventricular mural or apical  thrombus/thrombi.   LV Wall Scoring:  The apical lateral segment, apical septal segment, apical anterior  segment,  and apical inferior segment are akinetic. The mid anteroseptal segment,  mid  anterolateral segment, mid inferoseptal segment, mid anterior segment, and  mid inferior segment are hypokinetic.   Right Ventricle: The right ventricular size is normal. No increase in  right ventricular wall thickness. Right ventricular systolic function is  normal. Tricuspid regurgitation signal is inadequate for assessing PA  pressure.   Left Atrium: Left atrial size was normal in size.   Right Atrium: Right atrial size was normal in size.   Pericardium: There is no evidence of pericardial effusion.   Mitral Valve: The mitral valve is normal in  structure. No evidence of  mitral valve regurgitation. No evidence of mitral valve stenosis.   Tricuspid Valve: The tricuspid valve is normal in structure. Tricuspid  valve regurgitation is trivial. No evidence of tricuspid stenosis.   Aortic Valve: The aortic valve is normal in structure. Aortic valve  regurgitation is trivial. No aortic stenosis is present. Aortic valve mean  gradient measures 2.0 mmHg. Aortic valve peak gradient measures 3.3 mmHg.  Aortic valve area, by VTI measures  3.64 cm.   Pulmonic Valve: The pulmonic valve was normal in structure. Pulmonic  valve  regurgitation is trivial. No evidence of pulmonic stenosis.   Aorta: The aortic root is normal in size and structure.   Venous: The inferior vena cava was not well visualized.   IAS/Shunts: The interatrial septum was not well visualized.  Cath: 11/09/20  Mid LAD lesion is 100% stenosed. LV end diastolic pressure is severely elevated.   1. 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 Elevated LVEDP of 34 mm Hg   Plan: medical management. Will give IV lasix 40 mg x 1   Diagnostic Dominance: Right    Patient Profile     35 y.o. male with polysubstance abuse presents with anterior MI, complicated presentation with acute agitation, drug overdose, acute kidney injury, anemia  Assessment & Plan    Anterior MI: hsTn peaked 39030. EKG showed persistent STE in anterolateral leads along with Q waves yesterday. Underwent cardiac cath noted above with occluded LAD with recommendation for medical management. Did have some chest pain post cath, started on Imdur 30mg  daily with improvement. Will order CR today for education.  -- On  ASA, carvedilol, atorvastatin 80 mg. Added Imdur 30mg  daily and losartan 25mg  daily  Acute systolic heart failure: EF 30-35% on echo, with WMA in the LAD territory. LVEDP elevated at 34. Given IV lasix 40mg  post cath, 1.5L UOP. Breathing is stable.  -- Continue coreg, but increase  to 6.25mg  BID, will add losartan 25mg  daily. Considered Entresto but he is uninsured. May be a candidate for the [facility] (will reach out).   Encephalopathy: improved/resolved  Anemia: suspect erroneous lab. Remains stable at 12.3  AKI: resolved, Cr 0.95 post cath -- will add losartan 25mg  daily  Suspected post MI pericarditis: Symptoms yesterday felt to be pericarditis. Started on colchicine with improvement.   Hypokalemia: K+ 3.4 -- supplement  HLD: LDL 84 -- now on high dose statin  For questions or updates, please contact [facility] Please consult [email or web address] for contact info under        Signed, , NP  11/10/2020, 10:36 AM    Patient seen, examined. Available data reviewed. Agree with findings, assessment, and plan as outlined by , NP.  On my exam the patient is alert, oriented, in no distress.  Lungs are clear, heart is regular rate and rhythm with no murmur gallop, abdomen soft nontender, extremities show no edema, radial cath site is clear.  Cardiac catheterization films are reviewed and these demonstrate total thrombotic occlusion of the mid LAD.  This is the culprit for the patient's anterior infarct.  He is not revascularized because of late presentation to the Cath Lab.  Medical therapy is indicated.  I reviewed his medical program which includes losartan, isosorbide, and carvedilol.  We will add low-dose Aldactone for diuretic therapy and aldosterone antagonism in the setting of anterior infarct.  He is complaining of some dizziness with standing.  We will monitor overnight with the multiple medications added today and would anticipate hospital discharge tomorrow if he is medically stable.  , M.D. 11/10/2020 12:26 PM

## 2020-11-10 NOTE — Progress Notes (Signed)
Heart Failure Stewardship Pharmacist Progress Note   PCP: Pcp, No PCP-Cardiologist: None    HPI:  35 yo M with PMH of polysubstance abuse. He presented to the ED on 6/28 from prison for a drug overdose. Code STEMI was called due to ST elevation on EKG. Cath was deferred since EKG changes likely related to illicit drug use/vasospasm. An ECHO was done on 6/29 and LVEF was 30-35% and RV systolic function was normal. He was taken to the cath lab on 6/30 and found to have single vessel CAD (midLAD 100% stenosed) and elevated LVEDP. Planning medical management.   Current HF Medications: Carvedilol 6.25 mg BID Losartan 25 mg daily Imdur 30 mg daily  Prior to admission HF Medications: None  Pertinent Lab Values: Serum creatinine 0.95, BUN 7, Potassium 3.4, Sodium 133, Magnesium 2.1  Vital Signs: Weight: 361 lbs - possible error due to weight trend (admission weight: 341 lbs) Blood pressure: 110/70s  Heart rate: 80-90s   Medication Assistance / Insurance Benefits Check: Does the patient have prescription insurance?  No  Outpatient Pharmacy:  Prior to admission outpatient pharmacy: None Is the patient willing to use [facility] TOC pharmacy at discharge? Yes Is the patient willing to transition their outpatient pharmacy to utilize a [facility] outpatient pharmacy?   Pending    Assessment: 1. Acute systolic CHF (EF 28-31%), due to ICM. NYHA class II symptoms. - No JVD or edema on MD exam - continue to hold off on diuretics at this time - Agree with increasing to carvedilol 6.25 mg BID - Agree with starting losartan 25 mg daily. Consider optimizing to [facility]. Can enroll patient in patient assistance since he is uninsured. Will follow up with application during HF TOC appt once disposition is determined (jail vs home). - Consider adding spironolactone 12.5 mg daily prior to discharge - Consider adding SGLT2i prior to discharge - Continue Imdur 30 mg daily - Consider adding hydralazine if BP  able to tolerate   Plan: 1) Medication changes recommended at this time: - Agree with changes as above; recommend adding spironolactone 12.5 mg daily tomorrow if BP allows  2) Patient assistance: - None pending at this time - Upon discharge, plans to return to jail - HF TOC appt scheduled for 7/11 (can reschedule if unable to get transportation / if he's unable to make appts from jail, etc)  3)  Education  - Patient has been educated on current HF medications and potential additions to HF medication regimen  - Patient verbalizes understanding that over the next few months, these medication doses may change and more medications may be added to optimize HF regimen - Patient has been educated on basic disease state pathophysiology and goals of therapy - Time spent (30 mins)   Sharen Hones, PharmD, BCPS Heart Failure Stewardship Pharmacist Phone [phone]

## 2020-11-10 NOTE — Progress Notes (Addendum)
Heart Failure Navigation Team Progress Note  PCP: Pcp, No Primary Cardiologist: N/A Admitted from: jail  History reviewed. No pertinent past medical history.  Social History   Socioeconomic History   Marital status: Single    Spouse name: Not on file   Number of children: Not on file   Years of education: Not on file   Highest education level: Not on file  Occupational History   Not on file  Tobacco Use   Smoking status: Never   Smokeless tobacco: Never  Substance and Sexual Activity   Alcohol use: Yes   Drug use: Yes    Types: Amphetamines, Methamphetamines, Marijuana   Sexual activity: Not on file  Other Topics Concern   Not on file  Social History Narrative   Not on file   Social Determinants of Health   Financial Resource Strain: Not on file  Food Insecurity: Not on file  Transportation Needs: Not on file  Physical Activity: Not on file  Stress: Not on file  Social Connections: Not on file     Heart & Vascular Transition of Care Clinic follow-up: Scheduled for Monday 11/20/2020 at 10am.  Confirmed patient may have transportation from his mom but CSW also provided him with a business card for [employer] transportation in case he doesn't have a ride and will need one to please call the number listed on the card.  Immediate social needs: Everything. The patient doesn't have any health insurance, he will need medication assistance, help applying for disability and Food Stamps, and potentially housing, therapy and substance use resources. Patient to follow up with HF outpatient clinic and social worker.  CSW spoke with Mr. Pitter at bedside per referral from the HF pharmacist and patient not having a safe discharge plan. CSW obtained permission to speak with the patients father and mother. The patients father didn't want to hear from the social worker and suggested the CSW reach out to the patients mother. CSW spoke with Ms. Selena Batten [phone] regarding patients current  situation and his father declining patient to return to his house upon discharge from the hospital. CSW provided the patient's mother with the telephone number to the patients room and informed her that he will be able to speak on the phone once the jail has released him from custody and paperwork is signed. Ms. Selena Batten reported understanding and is open to speaking with her son. Mr. Rollyson is unsure of where his wallet, car keys, eyeglasses and phone are located potentially in custody of the jail or at the parking lot at the [facility] in [place] where his car was parked before getting arrested. Mr. Castrejon reported that if he can't stay with his dad he is sure his mom will pick him up from the hospital and he can stay with her and CSW encouraged Mr. [place] to call his mom once released from custody of [place]. Mr. Parson reported being open to therapy and substance use treatment but that he doesn't have health insurance and will need assistance. Mr. Fox to follow up with the outpatient HF clinic 11/20/20 at 10am.   [facility], MSW, LCSWA [phone] Heart Failure Social Worker

## 2020-11-11 LAB — CBC
HCT: 39.6 % (ref 39.0–52.0)
Hemoglobin: 12.9 g/dL — ABNORMAL LOW (ref 13.0–17.0)
MCH: 27 pg (ref 26.0–34.0)
MCHC: 32.6 g/dL (ref 30.0–36.0)
MCV: 82.8 fL (ref 80.0–100.0)
Platelets: 275 10*3/uL (ref 150–400)
RBC: 4.78 MIL/uL (ref 4.22–5.81)
RDW: 13.6 % (ref 11.5–15.5)
WBC: 9.4 10*3/uL (ref 4.0–10.5)
nRBC: 0 % (ref 0.0–0.2)

## 2020-11-11 LAB — COMPREHENSIVE METABOLIC PANEL
ALT: 52 U/L — ABNORMAL HIGH (ref 0–44)
AST: 42 U/L — ABNORMAL HIGH (ref 15–41)
Albumin: 3 g/dL — ABNORMAL LOW (ref 3.5–5.0)
Alkaline Phosphatase: 64 U/L (ref 38–126)
Anion gap: 8 (ref 5–15)
BUN: 11 mg/dL (ref 6–20)
CO2: 27 mmol/L (ref 22–32)
Calcium: 9.2 mg/dL (ref 8.9–10.3)
Chloride: 99 mmol/L (ref 98–111)
Creatinine, Ser: 1.05 mg/dL (ref 0.61–1.24)
GFR, Estimated: >60 mL/min (ref >60)
Glucose, Bld: 115 mg/dL — ABNORMAL HIGH (ref 70–99)
Potassium: 3.5 mmol/L (ref 3.5–5.1)
Sodium: 134 mmol/L — ABNORMAL LOW (ref 135–145)
Total Bilirubin: 0.5 mg/dL (ref 0.3–1.2)
Total Protein: 7.3 g/dL (ref 6.5–8.1)

## 2020-11-11 MED ORDER — ISOSORBIDE MONONITRATE ER 30 MG PO TB24: 30.0000 mg | ORAL_TABLET | Freq: Every day | ORAL | 0 refills | Status: AC

## 2020-11-11 MED ORDER — ASPIRIN 81 MG PO TBEC: 81.0000 mg | DELAYED_RELEASE_TABLET | Freq: Every day | ORAL | 0 refills | Status: AC

## 2020-11-11 MED ORDER — NITROGLYCERIN 0.4 MG SL SUBL: 0.4000 mg | SUBLINGUAL_TABLET | SUBLINGUAL | 0 refills | Status: AC | PRN

## 2020-11-11 MED ORDER — ATORVASTATIN CALCIUM 80 MG PO TABS: 80.0000 mg | ORAL_TABLET | Freq: Every day | ORAL | 0 refills | Status: AC

## 2020-11-11 MED ORDER — CARVEDILOL 6.25 MG PO TABS: 6.2500 mg | ORAL_TABLET | Freq: Two times a day (BID) | ORAL | 0 refills | Status: AC

## 2020-11-11 MED ORDER — COLCHICINE 0.6 MG PO TABS: 0.6000 mg | ORAL_TABLET | Freq: Two times a day (BID) | ORAL | 0 refills | Status: AC

## 2020-11-11 MED ORDER — LOSARTAN POTASSIUM 25 MG PO TABS: 25.0000 mg | ORAL_TABLET | Freq: Every day | ORAL | 0 refills | Status: AC

## 2020-11-11 NOTE — Progress Notes (Addendum)
I have sent a message to our office's scheduling team requesting a follow-up appointment, and our office will call the patient with this information. Dr. Rhona Leavens has asked nurse to obtain updated contact information for patient so that he will have a phone number available in the chart. Edit - per Dr. Rhona Leavens the phone # is [phone]. I asked that they add this to his demographics.

## 2020-11-11 NOTE — Progress Notes (Signed)
Progress Note  Patient Name: Ricky Berg Date of Encounter: 11/11/2020  Primary Cardiologist:   None   Subjective   He denies chest pain to me but apparently had some early this morning and was with NTG.  This was sharp.   Denies SOB.  He has not ambulated because he did not have his glasses.    Inpatient Medications    Scheduled Meds:  aspirin EC  81 mg Oral Daily   atorvastatin  80 mg Oral Daily   carvedilol  6.25 mg Oral BID WC   Chlorhexidine Gluconate Cloth  6 each Topical Daily   colchicine  0.6 mg Oral BID   folic acid  1 mg Oral Daily   heparin  5,000 Units Subcutaneous Q8H   isosorbide mononitrate  30 mg Oral Daily   losartan  25 mg Oral Daily   sodium chloride flush  10-40 mL Intracatheter Q12H   sodium chloride flush  3 mL Intravenous Q12H   thiamine  100 mg Oral Daily   Continuous Infusions:  sodium chloride     PRN Meds: sodium chloride, docusate sodium, LORazepam, morphine injection, nitroGLYCERIN, polyethylene glycol, sodium chloride flush, sodium chloride flush   Vital Signs    Vitals:   11/11/20 0626 11/11/20 0655 11/11/20 0804 11/11/20 1115  BP: 98/80  123/75 112/69  Pulse: 96  89 89  Resp: 12 17 19 17   Temp: 98.8 F (37.1 C)  98.8 F (37.1 C) 98.5 F (36.9 C)  TempSrc: Oral  Oral Oral  SpO2: 100%  97% 98%  Weight:      Height:        Intake/Output Summary (Last 24 hours) at 11/11/2020 1207 Last data filed at 11/11/2020 0258 Gross per 24 hour  Intake 600 ml  Output --  Net 600 ml   Filed Weights   11/09/20 0244 11/10/20 0612 11/11/20 0331  Weight: (!) 155.7 kg (!) 164 kg (!) 156 kg    Telemetry    NSR - Personally Reviewed  ECG    NSR, rate 74, diffuse ST elevation not different than most recent EKG and improved compared to previous EKG.  - Personally Reviewed  Physical Exam   GEN: No acute distress.   Neck: No  JVD Cardiac: RRR, no murmurs, rubs, or gallops.  Respiratory: Clear  to auscultation bilaterally. GI: Soft,  nontender, non-distended  MS: No  edema; No deformity. Neuro:  Nonfocal  Psych: Normal affect   Labs    Chemistry Recent Labs  Lab 11/09/20 0125 11/09/20 0551 11/10/20 0500 11/11/20 0500  NA 131*  --  133* 134*  K 5.3* 3.5 3.4* 3.5  CL 99  --  98 99  CO2 24  --  27 27  GLUCOSE 120*  --  118* 115*  BUN 14  --  7 11  CREATININE 1.01  --  0.95 1.05  CALCIUM 8.5*  --  8.5* 9.2  PROT 7.6  --  7.2 7.3  ALBUMIN 3.7  --  3.1* 3.0*  AST 225*  --  89* 42*  ALT 88*  --  63* 52*  ALKPHOS 50  --  56 64  BILITOT 1.9*  --  1.1 0.5  GFRNONAA >60  --  >60 >60  ANIONGAP 8  --  8 8     Hematology Recent Labs  Lab 11/09/20 0125 11/10/20 0500 11/11/20 0500  WBC 7.8 9.9 9.4  RBC 5.12 4.60 4.78  HGB 13.6 12.3* 12.9*  HCT 42.3 38.0* 39.6  MCV 82.6 82.6 82.8  MCH 26.6 26.7 27.0  MCHC 32.2 32.4 32.6  RDW 13.4 13.5 13.6  PLT 202 251 275    Cardiac EnzymesNo results for input(s): TROPONINI in the last 168 hours. No results for input(s): TROPIPOC in the last 168 hours.   BNPNo results for input(s): BNP, PROBNP in the last 168 hours.   DDimer No results for input(s): DDIMER in the last 168 hours.   Radiology    No results found.  Cardiac Studies   Echo: 11/08/20   IMPRESSIONS     1. Left ventricular ejection fraction, by estimation, is 30 to 35%. The  left ventricle has moderately decreased function. The left ventricle  demonstrates regional wall motion abnormalities (see scoring  diagram/findings for description). Left ventricular   diastolic parameters were normal.   2. Right ventricular systolic function is normal. The right ventricular  size is normal. Tricuspid regurgitation signal is inadequate for assessing  PA pressure.   3. The mitral valve is normal in structure. No evidence of mitral valve  regurgitation. No evidence of mitral stenosis.   4. The aortic valve is normal in structure. Aortic valve regurgitation is  trivial. No aortic stenosis is present.       Cath: 11/09/20   Mid LAD lesion is 100% stenosed. LV end diastolic pressure is severely elevated.   1. 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 Elevated LVEDP of 34 mm Hg   Plan: medical management. Will give IV lasix 40 mg x 1   Diagnostic Dominance: Right     Patient Profile     35 y.o. male with anterior MI, complicated presentation with acute agitation, drug overdose, acute kidney injury, anemia  Assessment & Plan    Anterior MI:  Would be OK to go home if he ambulates and is tolerating the meds as listed.   This is a very difficult social situation and we are going to get further input from our social workers to see if we can arrange any transportation for a TOC appt with [place]. He will also need help with meds.     Acute systolic heart failure:   Needs extensive education about salt and fluid.    Encephalopathy:    Improved.    Suspected post MI pericarditis:  OK to give colchicine x 7 days.     Hypokalemia:   Replaced.    HLD:  On high dose statin.    Followup :  Needs 7 day TOC      For questions or updates, please contact [facility] Please consult [email or web address] for contact info under Cardiology/STEMI.   Signed, Rollene Rotunda, MD  11/11/2020, 12:07 PM

## 2020-11-11 NOTE — Progress Notes (Addendum)
   11/11/20 0626  Vitals  Temp 98.8 F (37.1 C)  Temp Source Oral  BP 98/80  MAP (mmHg) 87  BP Location Left Arm  BP Method Automatic  Patient Position (if appropriate) Lying  Pulse Rate 96  Pulse Rate Source Monitor  ECG Heart Rate 97  Resp 12  MEWS COLOR  MEWS Score Color Yellow  Oxygen Therapy  SpO2 100 %  O2 Device Room Air  Pain Assessment  Pain Score 5  Pain Type Acute pain  Pain Location Chest  Pain Orientation Mid (across the chset)  Pain Descriptors / Indicators Pressure;Sharp  Pain Frequency Intermittent  Pain Onset On-going  Pain Intervention(s) Medication (See eMAR)  MEWS Score  MEWS Temp 0  MEWS Systolic 1  MEWS Pulse 0  MEWS RR 1  MEWS LOC 0  MEWS Score 2  Patient c/o sharp chest pain chest pressure 5/10, vital signs are stable, patient denise SOB, he stated that he feels dizzy only when  when trying to urinate, no sign of distress noted, EKG obtained shows acute MI/STEMI, nitro 0.4mg  given, [place] MD notified, will continue to monitor.

## 2020-11-11 NOTE — TOC Transition Note (Signed)
Transition of Care [facility]) - CM/SW Discharge Note   Patient Details  Name: Ricky Berg MRN: [id number] Date of Birth: 1986-01-12  Transition of Care [facility]) CM/SW Contact:  Leone Haven, RN Phone Number: 11/11/2020, 1:37 PM   Clinical Narrative:    NCM was notified that patient needs ast with medications.  NCM assisted patient with Match Letter, he has no insurance.    Final next level of care: Home/Self Care Barriers to Discharge: No Barriers Identified   Patient Goals and CMS Choice Patient states their goals for this hospitalization and ongoing recovery are:: return home      Discharge Placement                       Discharge Plan and Services                  DME Agency: NA                  Social Determinants of Health (SDOH) Interventions     Readmission Risk Interventions No flowsheet data found.

## 2020-11-11 NOTE — Discharge Summary (Signed)
Physician Discharge Summary  Ricky Berg FAO:[id number] DOB: 03/18/1986 DOA: 11/07/2020  PCP: Pcp, No  Admit date: 11/07/2020 Discharge date: 11/11/2020  Admitted From: Home Disposition:  Home  Recommendations for Outpatient Follow-up:  Follow up with PCP in 1-2 weeks Follow up with Cardiology as scheduled  Discharge Condition:Stable CODE STATUS:Full Diet recommendation: Heart healthy   Brief/Interim Summary: 35yo presents from jail with AMS and chest pain after being processed in jail for polysubstance abuse.  Discharge Diagnoses:  Active Problems:   AMS (altered mental status)   ST elevation myocardial infarction involving left anterior descending (LAD) coronary artery (HCC)  STEMI S/p heart cath 6/30 with findings of single vessel disease Recommendation for medical management, see below for list of d/c meds Appreciate Cardiology assistance Patient is to follow up closely with Cardiology as outpatient ARF Suspect secondary to polysubstance abuse Renal function has normalized Transaminitis Likely secondary to polysubstance abuse LFT's trended down Acute toxic metabolic encephalopathy Resolved Conversing normally   Discharge Instructions  Discharge Instructions     Amb Referral to Cardiac Rehabilitation   Complete by: As directed    Referring to [facility] CRP 2   Diagnosis: STEMI   After initial evaluation and assessments completed: Virtual Based Care may be provided alone or in conjunction with Phase 2 Cardiac Rehab based on patient barriers.: Yes      Allergies as of 11/11/2020       Reactions   Haloperidol And Related Anaphylaxis   Face, tongue, and throat swelling        Medication List     TAKE these medications    aspirin 81 MG EC tablet Take 1 tablet (81 mg total) by mouth daily. Swallow whole. Start taking on: November 12, 2020   atorvastatin 80 MG tablet Commonly known as: LIPITOR Take 1 tablet (80 mg total) by mouth daily. Start taking on: November 12, 2020   carvedilol 6.25 MG tablet Commonly known as: COREG Take 1 tablet (6.25 mg total) by mouth 2 (two) times daily with a meal.   colchicine 0.6 MG tablet Take 1 tablet (0.6 mg total) by mouth 2 (two) times daily.   isosorbide mononitrate 30 MG 24 hr tablet Commonly known as: IMDUR Take 1 tablet (30 mg total) by mouth daily. Start taking on: November 12, 2020   losartan 25 MG tablet Commonly known as: COZAAR Take 1 tablet (25 mg total) by mouth daily. Start taking on: November 12, 2020   nitroGLYCERIN 0.4 MG SL tablet Commonly known as: NITROSTAT Place 1 tablet (0.4 mg total) under the tongue every 5 (five) minutes as needed for chest pain.        Follow-up Information     Tonny Bollman, MD Follow up.   Specialty: Cardiology Why: Our office will call you for a follow-up appointment. Please call the office if you have not heard from [place] within 3 days. Contact information: [address] [place] [address] [phone]                Allergies  Allergen Reactions   Haloperidol And Related Anaphylaxis    Face, tongue, and throat swelling    Consultations: Cardiology  Procedures/Studies: CARDIAC CATHETERIZATION  Result Date: 11/09/2020  Mid LAD lesion is 100% stenosed.  LV end diastolic pressure is severely elevated.  1. 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 Elevated LVEDP of 34 mm Hg Plan: medical management. Will give IV lasix 40 mg x 1   DG Chest  Port 1 View  Result Date: 11/07/2020 CLINICAL DATA:  Chest pain and drug overdose EXAM: PORTABLE CHEST 1 VIEW COMPARISON:  None. FINDINGS: A small portion of the lateral left base is not visualized. Visualized lungs are clear. Heart is upper normal in size with pulmonary vascularity normal. No adenopathy. No pneumothorax. No bone lesions. IMPRESSION: No edema or airspace opacity. Note that a small portion of the lateral left base not visualized. Heart upper normal in size. No  adenopathy evident. Electronically Signed   By: Bretta Bang III M.D.   On: 11/07/2020 09:32   EEG adult  Result Date: 11/08/2020 Charlsie Quest, MD     11/08/2020  8:53 AM Patient Name: Ricky Berg MRN: [id number] Epilepsy Attending: Charlsie Quest Referring Physician/Provider: Dr Levon Hedger Date: 11/07/2020 Duration: 23.24 mins Patient history: 35 year old male with altered mental status.  EEG to evaluate for seizures. Level of alertness: Awake, asleep AEDs during EEG study: None Technical aspects: This EEG study was done with scalp electrodes positioned according to the 10-20 International system of electrode placement. Electrical activity was acquired at a sampling rate of  and reviewed with a high frequency filter of  and a low frequency filter of . EEG data were recorded continuously and digitally stored. Description: The posterior dominant rhythm consists of 10 Hz activity of moderate voltage (25-35 uV) seen predominantly in posterior head regions, symmetric and reactive to eye opening and eye closing. Sleep was characterized by vertex waves, sleep spindles (12 to 14 Hz), maximal frontocentral region. Hyperventilation and photic stimulation were not performed.   IMPRESSION: This study is within normal limits. No seizures or epileptiform discharges were seen throughout the recording. Charlsie Quest   ECHOCARDIOGRAM COMPLETE  Result Date: 11/08/2020    ECHOCARDIOGRAM REPORT   Patient Name:   Ricky Berg Date of Exam: 11/08/2020 Medical Rec #:  [id number]    Height:       76.0 in Accession #:    [id number]   Weight:       344.6 lb Date of Birth:  12/25/1987    BSA:          2.791 m Patient Age:    35 years     BP:           160/59 mmHg Patient Gender: M            HR:           65 bpm. Exam Location:  Inpatient Procedure: 2D Echo, Cardiac Doppler and Color Doppler Indications:    Acute myocardial infarction  History:        Patient has no prior history of Echocardiogram  examinations.  Sonographer:    Shirlean Kelly Referring Phys: [id number] Migdalia Dk IMPRESSIONS  1. Left ventricular ejection fraction, by estimation, is 30 to 35%. The left ventricle has moderately decreased function. The left ventricle demonstrates regional wall motion abnormalities (see scoring diagram/findings for description). Left ventricular  diastolic parameters were normal.  2. Right ventricular systolic function is normal. The right ventricular size is normal. Tricuspid regurgitation signal is inadequate for assessing PA pressure.  3. The mitral valve is normal in structure. No evidence of mitral valve regurgitation. No evidence of mitral stenosis.  4. The aortic valve is normal in structure. Aortic valve regurgitation is trivial. No aortic stenosis is present. Conclusion(s)/Recommendation(s): No left ventricular mural or apical thrombus/thrombi. FINDINGS  Left Ventricle: Left ventricular ejection fraction, by estimation, is 30 to 35%. The left ventricle has  moderately decreased function. The left ventricle demonstrates regional wall motion abnormalities. The left ventricular internal cavity size was normal in size. There is no left ventricular hypertrophy. Left ventricular diastolic parameters were normal.  LV Wall Scoring: The apical lateral segment, apical septal segment, apical anterior segment, and apical inferior segment are akinetic. The mid anteroseptal segment, mid anterolateral segment, mid inferoseptal segment, mid anterior segment, and mid inferior segment are hypokinetic. Right Ventricle: The right ventricular size is normal. No increase in right ventricular wall thickness. Right ventricular systolic function is normal. Tricuspid regurgitation signal is inadequate for assessing PA pressure. Left Atrium: Left atrial size was normal in size. Right Atrium: Right atrial size was normal in size. Pericardium: There is no evidence of pericardial effusion. Mitral Valve: The mitral valve is  normal in structure. No evidence of mitral valve regurgitation. No evidence of mitral valve stenosis. Tricuspid Valve: The tricuspid valve is normal in structure. Tricuspid valve regurgitation is trivial. No evidence of tricuspid stenosis. Aortic Valve: The aortic valve is normal in structure. Aortic valve regurgitation is trivial. No aortic stenosis is present. Aortic valve mean gradient measures 2.0 mmHg. Aortic valve peak gradient measures 3.3 mmHg. Aortic valve area, by VTI measures 3.64 cm. Pulmonic Valve: The pulmonic valve was normal in structure. Pulmonic valve regurgitation is trivial. No evidence of pulmonic stenosis. Aorta: The aortic root is normal in size and structure. Venous: The inferior vena cava was not well visualized. IAS/Shunts: The interatrial septum was not well visualized.  LEFT VENTRICLE PLAX 2D LVIDd:         4.60 cm  Diastology LVIDs:         3.00 cm  LV e' medial:    7.94 cm/s LV PW:         1.00 cm  LV E/e' medial:  10.8 LV IVS:        1.00 cm  LV e' lateral:   7.40 cm/s LVOT diam:     2.30 cm  LV E/e' lateral: 11.6 LV SV:         73 LV SV Index:   26 LVOT Area:     4.15 cm  RIGHT VENTRICLE RV S prime:     6.85 cm/s TAPSE (M-mode): 1.6 cm LEFT ATRIUM             Index       RIGHT ATRIUM           Index LA diam:        4.20 cm 1.50 cm/m  RA Area:     18.00 cm LA Vol (A2C):   61.1 ml 21.89 ml/m RA Volume:   57.40 ml  20.56 ml/m LA Vol (A4C):   54.9 ml 19.67 ml/m LA Biplane Vol: 58.4 ml 20.92 ml/m  AORTIC VALVE AV Area (Vmax):    3.81 cm AV Area (Vmean):   3.92 cm AV Area (VTI):     3.64 cm AV Vmax:           90.50 cm/s AV Vmean:          59.850 cm/s AV VTI:            0.200 m AV Peak Grad:      3.3 mmHg AV Mean Grad:      2.0 mmHg LVOT Vmax:         83.00 cm/s LVOT Vmean:        56.500 cm/s LVOT VTI:          0.175 m  LVOT/AV VTI ratio: 0.88  AORTA Ao Root diam: 3.20 cm Ao Asc diam:  2.90 cm MITRAL VALVE MV Area (PHT): 4.46 cm    SHUNTS MV Decel Time: 170 msec    Systemic VTI:   0.18 m MV E velocity: 85.50 cm/s  Systemic Diam: 2.30 cm MV A velocity: 83.60 cm/s MV E/A ratio:  1.02 Weston Brass MD Electronically signed by Weston Brass MD Signature Date/Time: 11/08/2020/9:55:27 AM    Final    [place] EKG SITE RITE  Result Date: 11/08/2020 If Site Rite image not attached, placement could not be confirmed due to current cardiac rhythm.  US Abdomen Limited RUQ (LIVER/GB)  Result Date: 11/09/2020 CLINICAL DATA:  Transaminitis. EXAM: ULTRASOUND ABDOMEN LIMITED RIGHT UPPER QUADRANT COMPARISON:  No prior. FINDINGS: Gallbladder: No gallstones or wall thickening visualized. No sonographic Murphy sign noted by sonographer. Common bile duct: Diameter: 4.4 Liver: Increased echogenicity consistent fatty infiltration or hepatocellular disease. Portal vein is patent on color Doppler imaging with normal direction of blood flow towards the liver. Other: Suboptimal exam due to patient's body habitus. IMPRESSION: 1. Suboptimal exam due to patient's body habitus. No gallstones or biliary distention. 2. Increased hepatic echogenicity consistent fatty infiltration or hepatocellular disease. Electronically Signed   By: Maisie Fus  Register   On: 11/09/2020 07:22    Subjective: Without complaints  Discharge Exam: Vitals:   11/11/20 0804 11/11/20 1115  BP: 123/75 112/69  Pulse: 89 89  Resp: 19 17  Temp: 98.8 F (37.1 C) 98.5 F (36.9 C)  SpO2: 97% 98%   Vitals:   11/11/20 0626 11/11/20 0655 11/11/20 0804 11/11/20 1115  BP: 98/80  123/75 112/69  Pulse: 96  89 89  Resp: 12 17 19 17   Temp: 98.8 F (37.1 C)  98.8 F (37.1 C) 98.5 F (36.9 C)  TempSrc: Oral  Oral Oral  SpO2: 100%  97% 98%  Weight:      Height:        General: Pt is alert, awake, not in acute distress Cardiovascular: RRR, S1/S2 + Respiratory: CTA bilaterally, no wheezing, no rhonchi Abdominal: Soft, NT, ND, bowel sounds + Extremities: no edema, no cyanosis   The results of significant diagnostics from this  hospitalization (including imaging, microbiology, ancillary and laboratory) are listed below for reference.     Microbiology: Recent Results (from the past 240 hour(s))  Resp Panel by RT-PCR (Flu A&B, Covid) Nasopharyngeal Swab     Status: None   Collection Time: 11/07/20  8:49 AM   Specimen: Nasopharyngeal Swab; Nasopharyngeal(NP) swabs in vial transport medium  Result Value Ref Range Status   SARS Coronavirus 2 by RT PCR NEGATIVE NEGATIVE Final    Comment: (NOTE) SARS-CoV-2 target nucleic acids are NOT DETECTED.  The SARS-CoV-2 RNA is generally detectable in upper respiratory specimens during the acute phase of infection. The lowest concentration of SARS-CoV-2 viral copies this assay can detect is 138 copies/mL. A negative result does not preclude SARS-Cov-2 infection and should not be used as the sole basis for treatment or other patient management decisions. A negative result may occur with  improper specimen collection/handling, submission of specimen other than nasopharyngeal swab, presence of viral mutation(s) within the areas targeted by this assay, and inadequate number of viral copies(<138 copies/mL). A negative result must be combined with clinical observations, patient history, and epidemiological information. The expected result is Negative.  Fact Sheet for Patients:  11/09/20  Fact Sheet for Healthcare Providers:  BloggerCourse.com  This test is no t yet approved or  cleared by the [place] FDA and  has been authorized for detection and/or diagnosis of SARS-CoV-2 by FDA under an Emergency Use Authorization (EUA). This EUA will remain  in effect (meaning this test can be used) for the duration of the COVID-19 declaration under Section 564(b)(1) of the Act, 21 U.S.C.section 360bbb-3(b)(1), unless the authorization is terminated  or revoked sooner.       Influenza A by PCR NEGATIVE NEGATIVE Final    Influenza B by PCR NEGATIVE NEGATIVE Final    Comment: (NOTE) The Xpert Xpress SARS-CoV-2/FLU/RSV plus assay is intended as an aid in the diagnosis of influenza from Nasopharyngeal swab specimens and should not be used as a sole basis for treatment. Nasal washings and aspirates are unacceptable for Xpert Xpress SARS-CoV-2/FLU/RSV testing.  Fact Sheet for Patients: BloggerCourse.comhttps://www.fda.gov/media/152166/download  Fact Sheet for Healthcare Providers: SeriousBroker.ithttps://www.fda.gov/media/152162/download  This test is not yet approved or cleared by the [place] FDA and has been authorized for detection and/or diagnosis of SARS-CoV-2 by FDA under an Emergency Use Authorization (EUA). This EUA will remain in effect (meaning this test can be used) for the duration of the COVID-19 declaration under Section 564(b)(1) of the Act, 21 U.S.C. section 360bbb-3(b)(1), unless the authorization is terminated or revoked.  Performed at [facility], [address] [place], [place] [address]   SARS CORONAVIRUS 2 (TAT 6-24 HRS) Nasopharyngeal Nasopharyngeal Swab     Status: None   Collection Time: 11/07/20  1:10 PM   Specimen: Nasopharyngeal Swab  Result Value Ref Range Status   SARS Coronavirus 2 NEGATIVE NEGATIVE Final    Comment: (NOTE) SARS-CoV-2 target nucleic acids are NOT DETECTED.  The SARS-CoV-2 RNA is generally detectable in upper and lower respiratory specimens during the acute phase of infection. Negative results do not preclude SARS-CoV-2 infection, do not rule out co-infections with other pathogens, and should not be used as the sole basis for treatment or other patient management decisions. Negative results must be combined with clinical observations, patient history, and epidemiological information. The expected result is Negative.  Fact Sheet for Patients: HairSlick.nohttps://www.fda.gov/media/138098/download  Fact Sheet for Healthcare  Providers: quierodirigir.comhttps://www.fda.gov/media/138095/download  This test is not yet approved or cleared by the [place] FDA and  has been authorized for detection and/or diagnosis of SARS-CoV-2 by FDA under an Emergency Use Authorization (EUA). This EUA will remain  in effect (meaning this test can be used) for the duration of the COVID-19 declaration under Se ction 564(b)(1) of the Act, 21 U.S.C. section 360bbb-3(b)(1), unless the authorization is terminated or revoked sooner.  Performed at [facility], [address] [place], [place] [address]   MRSA Next Gen by PCR, Nasal     Status: None   Collection Time: 11/07/20  2:17 PM  Result Value Ref Range Status   MRSA by PCR Next Gen NOT DETECTED NOT DETECTED Final    Comment: (NOTE) The GeneXpert MRSA Assay (FDA approved for NASAL specimens only), is one component of a comprehensive MRSA colonization surveillance program. It is not intended to diagnose MRSA infection nor to guide or monitor treatment for MRSA infections. Test performance is not FDA approved in patients less than 35 years old. Performed at [facility], [address] [place], [place] [address]   Culture, blood (routine x 2)     Status: None (Preliminary result)   Collection Time: 11/07/20  3:44 PM   Specimen: BLOOD  Result Value Ref Range Status   Specimen Description BLOOD RIGHT FOOT  Final   Special Requests   Final    BOTTLES DRAWN AEROBIC ONLY Blood Culture adequate volume   Culture   Final    NO GROWTH 4 DAYS Performed at [facility], [address], [place], [place] [address]    Report Status PENDING  Incomplete  Culture, blood (Routine X 2) w Reflex to ID Panel     Status: None (Preliminary result)   Collection Time: 11/08/20  6:36 PM   Specimen: BLOOD LEFT HAND  Result Value Ref Range Status   Specimen Description BLOOD LEFT HAND  Final   Special Requests   Final    BOTTLES DRAWN AEROBIC ONLY Blood Culture results may not be  optimal due to an inadequate volume of blood received in culture bottles   Culture   Final    NO GROWTH 3 DAYS Performed at [facility], [address], [place], [place] [address]    Report Status PENDING  Incomplete     Labs: BNP (last 3 results) No results for input(s): BNP in the last 8760 hours. Basic Metabolic Panel: Recent Labs  Lab 11/07/20 2332 11/08/20 1854 11/09/20 0125 11/09/20 0551 11/10/20 0500 11/11/20 0500  NA 138 133* 131*  --  133* 134*  K 3.7 3.7 5.3* 3.5 3.4* 3.5  CL 107 99 99  --  98 99  CO2 --  27 27  GLUCOSE 143* 72 120*  --  118* 115*  BUN 29* 18 14  --  7 11  CREATININE 1.57* 1.07 1.01  --  0.95 1.05  CALCIUM 8.4* 8.7* 8.5*  --  8.5* 9.2  MG 2.2 2.0  --   --  2.1  --   PHOS 3.5 2.4*  --   --  2.6  --    Liver Function Tests: Recent Labs  Lab 11/07/20 2332 11/08/20 1854 11/09/20 0125 11/10/20 0500 11/11/20 0500  AST 189* 236* 225* 89* 42*  ALT 61* 80* 88* 63* 52*  ALKPHOS 38 50 50 56 64  BILITOT 0.6 0.9 1.9* 1.1 0.5  PROT 6.8 7.5 7.6 7.2 7.3  ALBUMIN 3.3* 3.6 3.7 3.1* 3.0*   Recent Labs  Lab 11/07/20 2342 11/08/20 1854  LIPASE 25 34  AMYLASE 22* 29   Recent Labs  Lab 11/07/20 0924  AMMONIA 27   CBC: Recent Labs  Lab 11/07/20 0903 11/07/20 1128 11/07/20 2332 11/08/20 1854 11/09/20 0125 11/10/20 0500 11/11/20 0500  WBC 10.3  --  8.6 5.8 7.8 9.9 9.4  NEUTROABS 7.4  --   --   --   --   --   --   HGB 17.2*   < > 8.6* 13.2 13.6 12.3* 12.9*  HCT 52.4*   < > 27.6* 41.3 42.3 38.0* 39.6  MCV 82.8  --  87.1 83.4 82.6 82.6 82.8  PLT 362  --  333 206 202 251 275   < > = values in this interval not displayed.   Cardiac Enzymes: No results for input(s): CKTOTAL, CKMB, CKMBINDEX, TROPONINI in the last 168 hours. BNP: Invalid input(s): POCBNP CBG: Recent Labs  Lab 11/07/20 1535  GLUCAP 140*   D-Dimer No results for input(s): DDIMER in the last 72 hours. Hgb A1c No results for input(s): HGBA1C in the last  72 hours. Lipid Profile Recent Labs    11/09/20 0125  CHOL 132  HDL 28*  LDLCALC 84  TRIG 865  CHOLHDL 4.7   Thyroid function studies No results  for input(s): TSH, T4TOTAL, T3FREE, THYROIDAB in the last 72 hours.  Invalid input(s): FREET3 Anemia work up No results for input(s): VITAMINB12, FOLATE, FERRITIN, TIBC, IRON, RETICCTPCT in the last 72 hours. Urinalysis No results found for: COLORURINE, APPEARANCEUR, LABSPEC, PHURINE, GLUCOSEU, HGBUR, BILIRUBINUR, KETONESUR, PROTEINUR, UROBILINOGEN, NITRITE, LEUKOCYTESUR Sepsis Labs Invalid input(s): PROCALCITONIN,  WBC,  LACTICIDVEN Microbiology Recent Results (from the past 240 hour(s))  Resp Panel by RT-PCR (Flu A&B, Covid) Nasopharyngeal Swab     Status: None   Collection Time: 11/07/20  8:49 AM   Specimen: Nasopharyngeal Swab; Nasopharyngeal(NP) swabs in vial transport medium  Result Value Ref Range Status   SARS Coronavirus 2 by RT PCR NEGATIVE NEGATIVE Final    Comment: (NOTE) SARS-CoV-2 target nucleic acids are NOT DETECTED.  The SARS-CoV-2 RNA is generally detectable in upper respiratory specimens during the acute phase of infection. The lowest concentration of SARS-CoV-2 viral copies this assay can detect is 138 copies/mL. A negative result does not preclude SARS-Cov-2 infection and should not be used as the sole basis for treatment or other patient management decisions. A negative result may occur with  improper specimen collection/handling, submission of specimen other than nasopharyngeal swab, presence of viral mutation(s) within the areas targeted by this assay, and inadequate number of viral copies(<138 copies/mL). A negative result must be combined with clinical observations, patient history, and epidemiological information. The expected result is Negative.  Fact Sheet for Patients:  BloggerCourse.com  Fact Sheet for Healthcare Providers:   SeriousBroker.it  This test is no t yet approved or cleared by the [place] FDA and  has been authorized for detection and/or diagnosis of SARS-CoV-2 by FDA under an Emergency Use Authorization (EUA). This EUA will remain  in effect (meaning this test can be used) for the duration of the COVID-19 declaration under Section 564(b)(1) of the Act, 21 U.S.C.section 360bbb-3(b)(1), unless the authorization is terminated  or revoked sooner.       Influenza A by PCR NEGATIVE NEGATIVE Final   Influenza B by PCR NEGATIVE NEGATIVE Final    Comment: (NOTE) The Xpert Xpress SARS-CoV-2/FLU/RSV plus assay is intended as an aid in the diagnosis of influenza from Nasopharyngeal swab specimens and should not be used as a sole basis for treatment. Nasal washings and aspirates are unacceptable for Xpert Xpress SARS-CoV-2/FLU/RSV testing.  Fact Sheet for Patients: BloggerCourse.com  Fact Sheet for Healthcare Providers: SeriousBroker.it  This test is not yet approved or cleared by the [place] FDA and has been authorized for detection and/or diagnosis of SARS-CoV-2 by FDA under an Emergency Use Authorization (EUA). This EUA will remain in effect (meaning this test can be used) for the duration of the COVID-19 declaration under Section 564(b)(1) of the Act, 21 U.S.C. section 360bbb-3(b)(1), unless the authorization is terminated or revoked.  Performed at [facility], [address] [place], [place] [address]   SARS CORONAVIRUS 2 (TAT 6-24 HRS) Nasopharyngeal Nasopharyngeal Swab     Status: None   Collection Time: 11/07/20  1:10 PM   Specimen: Nasopharyngeal Swab  Result Value Ref Range Status   SARS Coronavirus 2 NEGATIVE NEGATIVE Final    Comment: (NOTE) SARS-CoV-2 target nucleic acids are NOT DETECTED.  The SARS-CoV-2 RNA is generally detectable in upper and lower respiratory specimens during the  acute phase of infection. Negative results do not preclude SARS-CoV-2 infection, do not rule out co-infections with other pathogens, and should not be used as the sole basis for treatment or other patient management decisions. Negative  results must be combined with clinical observations, patient history, and epidemiological information. The expected result is Negative.  Fact Sheet for Patients: HairSlick.no  Fact Sheet for Healthcare Providers: quierodirigir.com  This test is not yet approved or cleared by the [place] FDA and  has been authorized for detection and/or diagnosis of SARS-CoV-2 by FDA under an Emergency Use Authorization (EUA). This EUA will remain  in effect (meaning this test can be used) for the duration of the COVID-19 declaration under Se ction 564(b)(1) of the Act, 21 U.S.C. section 360bbb-3(b)(1), unless the authorization is terminated or revoked sooner.  Performed at [facility], [address], [place], [place] [address]   MRSA Next Gen by PCR, Nasal     Status: None   Collection Time: 11/07/20  2:17 PM  Result Value Ref Range Status   MRSA by PCR Next Gen NOT DETECTED NOT DETECTED Final    Comment: (NOTE) The GeneXpert MRSA Assay (FDA approved for NASAL specimens only), is one component of a comprehensive MRSA colonization surveillance program. It is not intended to diagnose MRSA infection nor to guide or monitor treatment for MRSA infections. Test performance is not FDA approved in patients less than 40 years old. Performed at [facility], [address], [place], [place] [address]   Culture, blood (routine x 2)     Status: None (Preliminary result)   Collection Time: 11/07/20  3:44 PM   Specimen: BLOOD  Result Value Ref Range Status   Specimen Description BLOOD RIGHT FOOT  Final   Special Requests   Final    BOTTLES DRAWN AEROBIC ONLY Blood Culture adequate volume   Culture    Final    NO GROWTH 4 DAYS Performed at [facility], [address], [place], [place] [address]    Report Status PENDING  Incomplete  Culture, blood (Routine X 2) w Reflex to ID Panel     Status: None (Preliminary result)   Collection Time: 11/08/20  6:36 PM   Specimen: BLOOD LEFT HAND  Result Value Ref Range Status   Specimen Description BLOOD LEFT HAND  Final   Special Requests   Final    BOTTLES DRAWN AEROBIC ONLY Blood Culture results may not be optimal due to an inadequate volume of blood received in culture bottles   Culture   Final    NO GROWTH 3 DAYS Performed at [facility], [address] [place], [place] [address]    Report Status PENDING  Incomplete   Time spent: 30 min  SIGNED:   Rickey Barbara, MD  [facility] 11/11/2020, 2:41 PM  If 7PM-7AM, please contact night-coverage

## 2020-11-11 NOTE — Progress Notes (Signed)
Discharge instructions provided to patient. Monitor off CCMD notified. PICC removed by IV team. All medications, follow up appointments, and discharge instructions provided. Discharging to home with mom.  Sabra Heck, RN

## 2020-11-12 LAB — CULTURE, BLOOD (ROUTINE X 2)
Culture: NO GROWTH
Special Requests: ADEQUATE

## 2020-11-13 LAB — CULTURE, BLOOD (ROUTINE X 2): Culture: NO GROWTH

## 2020-11-17 ENCOUNTER — Telehealth (HOSPITAL_COMMUNITY): Payer: Self-pay

## 2020-11-17 NOTE — Telephone Encounter (Signed)
Per phase I cardiac rehab, fax cardiac rehab referral to [facility] cardiac rehab.

## 2020-11-20 ENCOUNTER — Encounter (HOSPITAL_COMMUNITY)

## 2020-11-21 ENCOUNTER — Encounter (HOSPITAL_COMMUNITY): Payer: Self-pay

## 2020-11-30 ENCOUNTER — Telehealth (HOSPITAL_COMMUNITY): Payer: Self-pay | Admitting: Surgery

## 2020-11-30 NOTE — Telephone Encounter (Signed)
I attempted to contact patient to provide a reminder for upcoming scheduled appt in the [facility].  His mother answered the phone and tells me that "[facility]--I don't know if he'll be there or not".  I asked that she or he call back to cancel if they find out he is unable to make the appt.

## 2020-12-01 ENCOUNTER — Inpatient Hospital Stay (HOSPITAL_COMMUNITY)
Admission: RE | Admit: 2020-12-01 | Discharge: 2020-12-01 | Disposition: A | Discharge status: Home / Self Care | Source: Ambulatory Visit

## 2020-12-21 ENCOUNTER — Encounter: Payer: Self-pay | Admitting: Cardiology

## 2020-12-21 NOTE — Progress Notes (Deleted)
Cardiology Office Note   Date:  12/21/2020   ID:  Ricky Berg, DOB 16-Apr-1986, MRN [id number]  PCP:  Pcp, No  Cardiologist:   None Referring:  ***  No chief complaint on file.     History of Present Illness: Ricky Berg is a 35 y.o. male who presents for follow up after hospitalization with an anterior myocardial infarction at the time of admission for AMS and drug overdose.  ***       No past medical history on file.  Past Surgical History:  Procedure Laterality Date   LEFT HEART CATH AND CORONARY ANGIOGRAPHY N/A 11/09/2020   Procedure: LEFT HEART CATH AND CORONARY ANGIOGRAPHY;  Surgeon: [place], Peter M, MD;  Location: [facility] INVASIVE CV LAB;  Service: Cardiovascular;  Laterality: N/A;     Current Outpatient Medications  Medication Sig Dispense Refill   atorvastatin (LIPITOR) 80 MG tablet Take 1 tablet (80 mg total) by mouth daily. 30 tablet 0   carvedilol (COREG) 6.25 MG tablet Take 1 tablet (6.25 mg total) by mouth 2 (two) times daily with a meal. 60 tablet 0   colchicine 0.6 MG tablet Take 1 tablet (0.6 mg total) by mouth 2 (two) times daily. 60 tablet 0   isosorbide mononitrate (IMDUR) 30 MG 24 hr tablet Take 1 tablet (30 mg total) by mouth daily. 30 tablet 0   losartan (COZAAR) 25 MG tablet Take 1 tablet (25 mg total) by mouth daily. 30 tablet 0   nitroGLYCERIN (NITROSTAT) 0.4 MG SL tablet Place 1 tablet (0.4 mg total) under the tongue every 5 (five) minutes as needed for chest pain. 30 tablet 0   No current facility-administered medications for this visit.    Allergies:   Haloperidol and related    Social History:  The patient  reports that he has never smoked. He has never used smokeless tobacco. He reports current alcohol use. He reports current drug use. Drugs: Amphetamines, Methamphetamines, and Marijuana.   Family History:  The patient's ***family history is not on file.    ROS:  Please see the history of present illness.   Otherwise, review of systems  are positive for {NONE DEFAULTED:18576}.   All other systems are reviewed and negative.    PHYSICAL EXAM: VS:  There were no vitals taken for this visit. , BMI There is no height or weight on file to calculate BMI. GENERAL:  Well appearing HEENT:  Pupils equal round and reactive, fundi not visualized, oral mucosa unremarkable NECK:  No jugular venous distention, waveform within normal limits, carotid upstroke brisk and symmetric, no bruits, no thyromegaly LYMPHATICS:  No cervical, inguinal adenopathy LUNGS:  Clear to auscultation bilaterally BACK:  No CVA tenderness CHEST:  Unremarkable HEART:  PMI not displaced or sustained,S1 and S2 within normal limits, no S3, no S4, no clicks, no rubs, *** murmurs ABD:  Flat, positive bowel sounds normal in frequency in pitch, no bruits, no rebound, no guarding, no midline pulsatile mass, no hepatomegaly, no splenomegaly EXT:  2 plus pulses throughout, no edema, no cyanosis no clubbing SKIN:  No rashes no nodules NEURO:  Cranial nerves II through XII grossly intact, motor grossly intact throughout PSYCH:  Cognitively intact, oriented to person place and time  CARDIAC CATH:  Diagnostic Dominance: Right       EKG:  EKG {ACTION; IS/IS JFH:[id number]} ordered today. The ekg ordered today demonstrates ***   Recent Labs: 11/10/2020: Magnesium 2.1 11/11/2020: ALT 52; BUN 11; Creatinine, Ser 1.05; Hemoglobin 12.9; Platelets 275;  Potassium 3.5; Sodium 134    Lipid Panel    Component Value Date/Time   CHOL 132 11/09/2020 0125   TRIG 101 11/09/2020 0125   HDL 28 (L) 11/09/2020 0125   CHOLHDL 4.7 11/09/2020 0125   VLDL 20 11/09/2020 0125   LDLCALC 84 11/09/2020 0125      Wt Readings from Last 3 Encounters:  No data found for Wt      Other studies Reviewed: Additional studies/ records that were reviewed today include: ***. Review of the above records demonstrates:  Please see elsewhere in the note.  ***   ASSESSMENT AND PLAN:  Anterior MI:   ***  hsTn peaked [id number]. EKG showed persistent STE in anterolateral leads along with Q waves yesterday. Underwent cardiac cath noted above with occluded LAD with recommendation for medical management. Did have some chest pain post cath, started on Imdur 30mg  daily with improvement. Will order CR today for education.  -- On  ASA, carvedilol, atorvastatin 80 mg. Added Imdur 30mg  daily and losartan 25mg  daily   Acute systolic heart failure:   ***  EF 30-35% on echo, with WMA in the LAD territory. LVEDP elevated at 34. Given IV lasix 40mg  post cath, 1.5L UOP. Breathing is stable.  -- Continue coreg, but increase to 6.25mg  BID, will add losartan 25mg  daily. Considered Entresto but he is uninsured. May be a candidate for [facility] (will reach out).    Encephalopathy:   ***  improved/resolved   Anemia:   ***  suspect erroneous lab. Remains stable at 12.3   AKI:  ***   resolved, Cr 0.95 post cath -- will add losartan 25mg  daily   Suspected post MI pericarditis:  *** Symptoms yesterday felt to be pericarditis. Started on colchicine with improvement.    HLD:  ***  LDL 84 -- now on high dose statin  Current medicines are reviewed at length with the patient today.  The patient {ACTIONS; HAS/DOES NOT HAVE:19233} concerns regarding medicines.  The following changes have been made:  {PLAN; NO CHANGE:13088:s}  Labs/ tests ordered today include: *** No orders of the defined types were placed in this encounter.    Disposition:   FU with ***    Signed, , MD  12/21/2020 4:29 PM    [facility]

## 2020-12-22 ENCOUNTER — Ambulatory Visit: Admitting: Cardiology

## 2022-09-05 IMAGING — DX DG CHEST 1V PORT
2 series · 2 of 2 positions shown · non-contrast
Comparison: None.

CLINICAL DATA: Chest pain and drug overdose

EXAM:
PORTABLE CHEST 1 VIEW

[chest ap (1 of 2)]
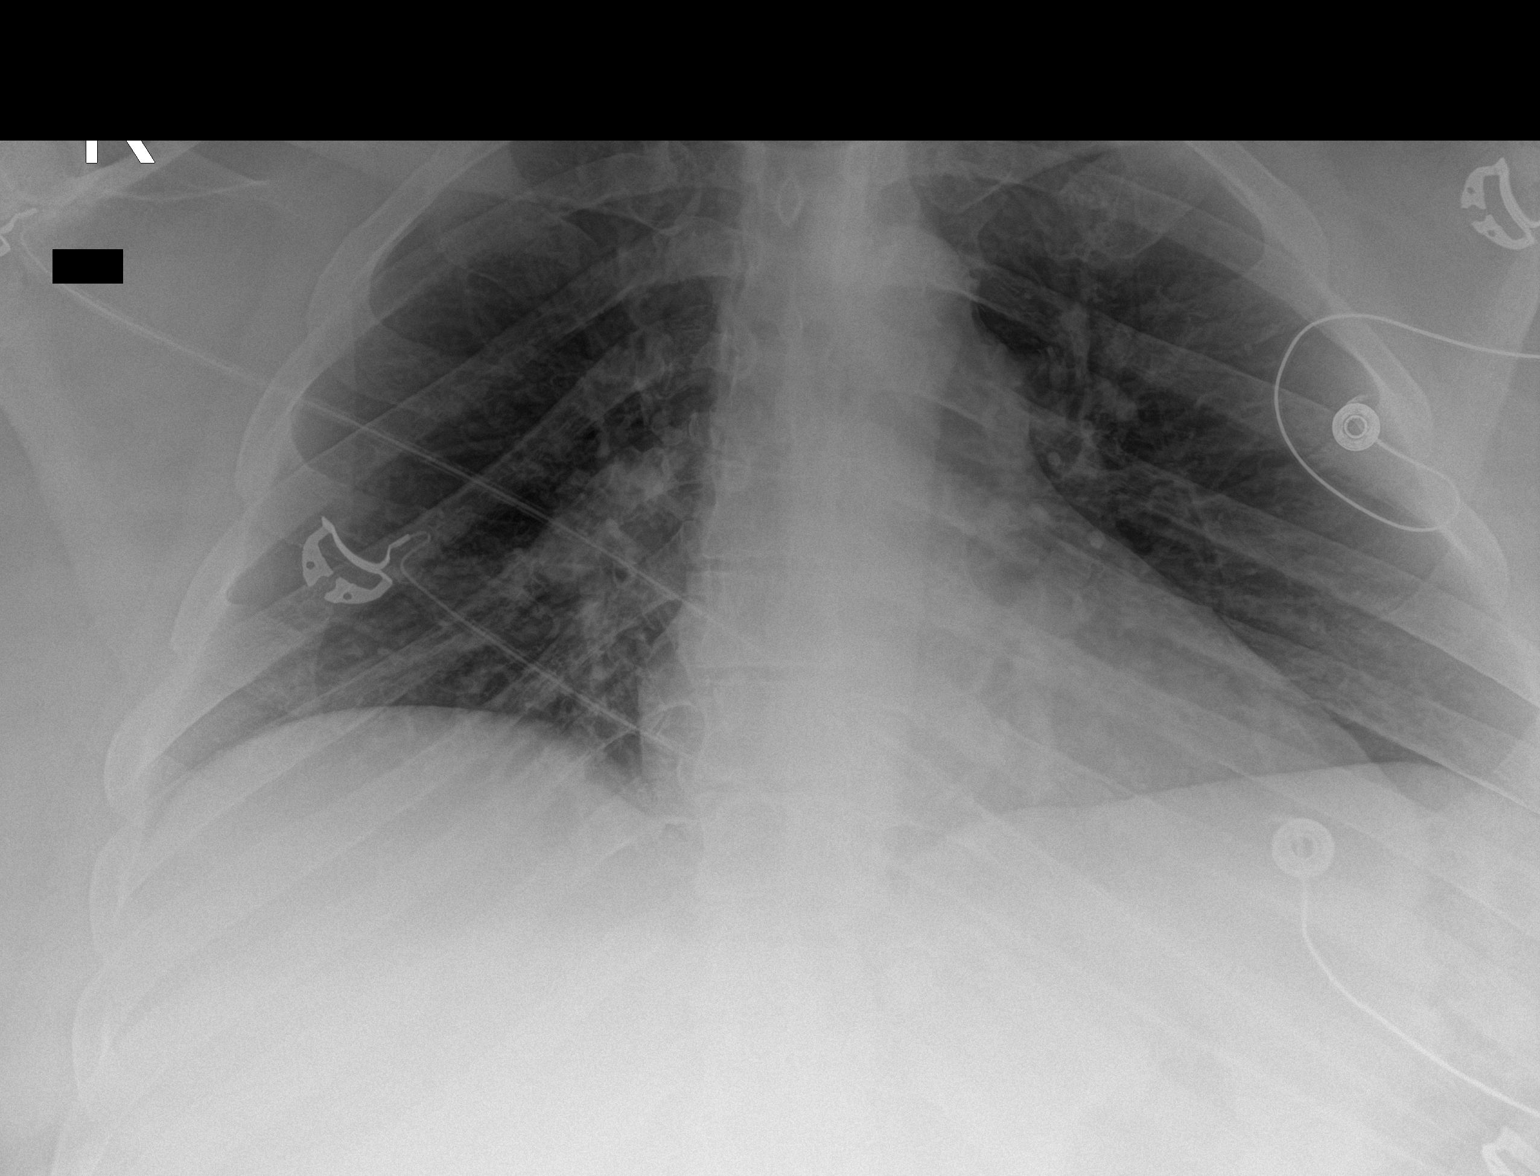

[chest ap (2 of 2)]
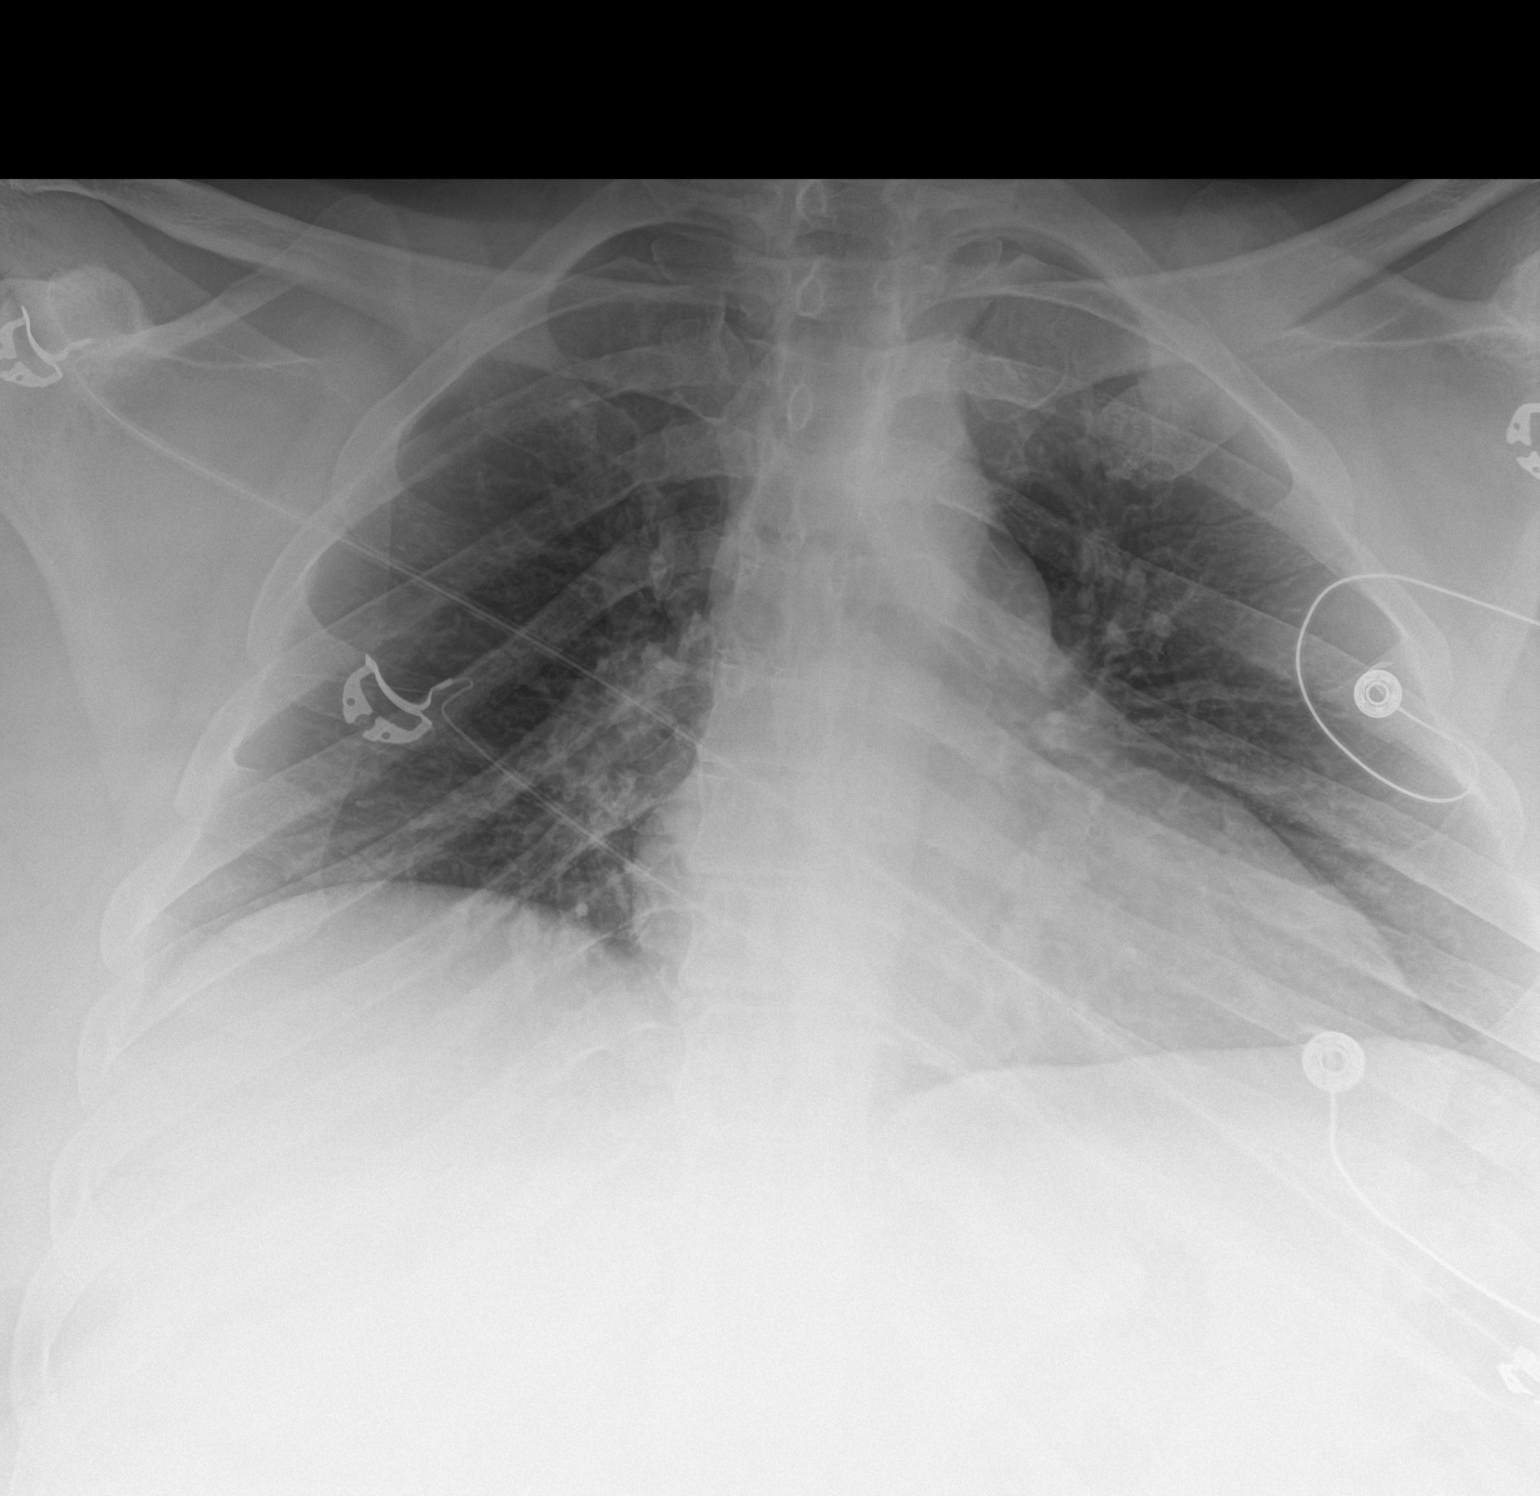

[2 of 2 positions shown; findings below may reference images not displayed]

FINDINGS: A small portion of the lateral left base is not visualized.
Visualized lungs are clear. Heart is upper normal in size with
pulmonary vascularity normal. No adenopathy. No pneumothorax. No
bone lesions.
IMPRESSION: No edema or airspace opacity. Note that a small portion of the
lateral left base not visualized. Heart upper normal in size. No
adenopathy evident.

## 2022-09-07 IMAGING — US US ABDOMEN LIMITED
1 series · 14 of 25 positions shown · non-contrast
Comparison: No prior.

CLINICAL DATA: Transaminitis.

EXAM:
ULTRASOUND ABDOMEN LIMITED RIGHT UPPER QUADRANT

[Series 1: us abdomen limited ruq (liver/gb) · 14 of 33 slices shown]
[im 1/33]
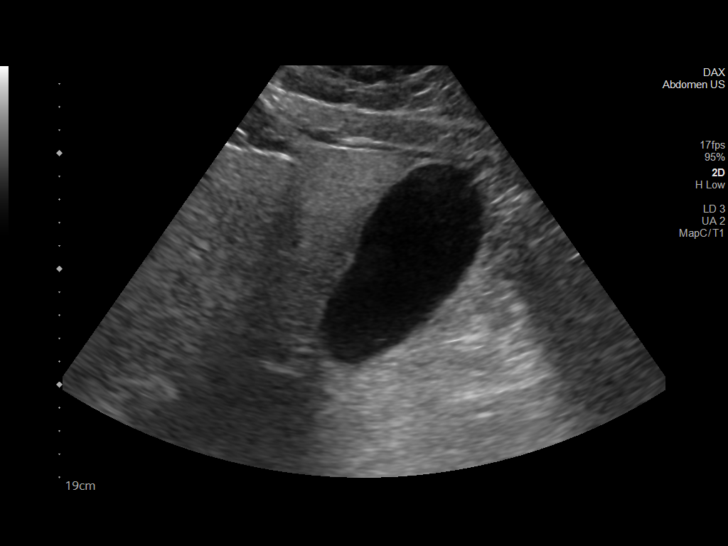
[im 3/33]
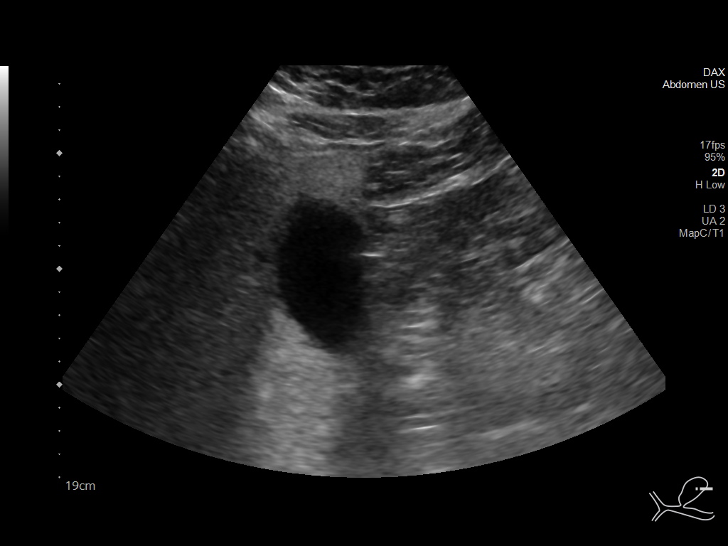
[im 6/33]
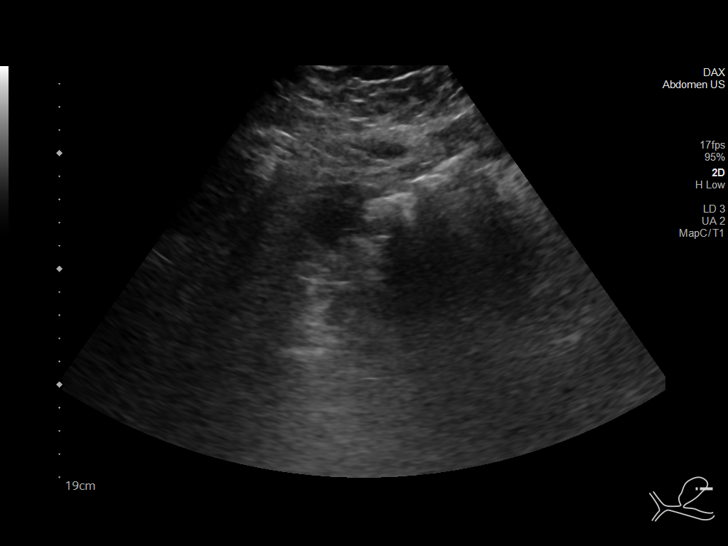
[im 9/33]
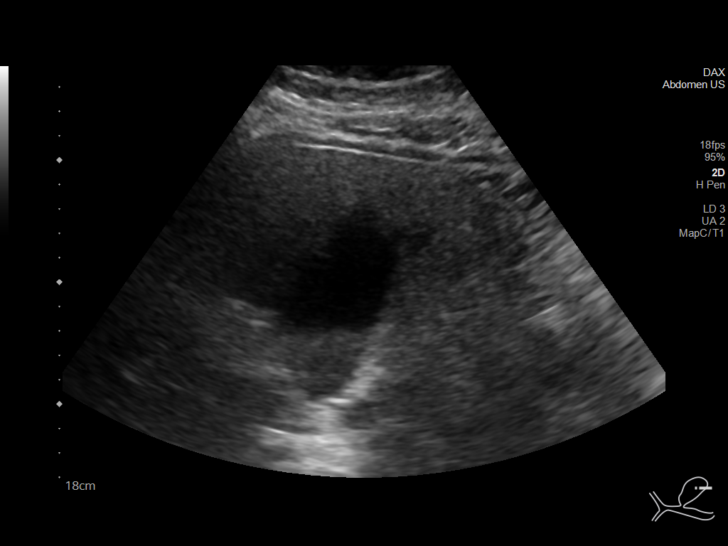
[im 11/33]
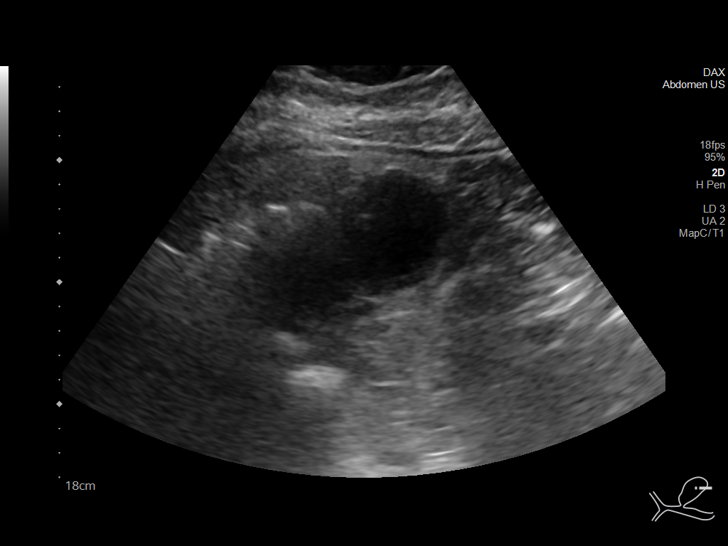
[im 13/33]
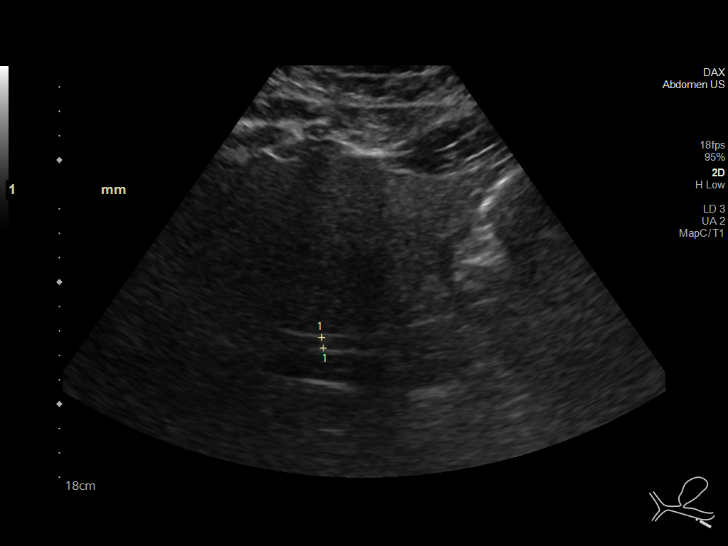
[im 15/33]
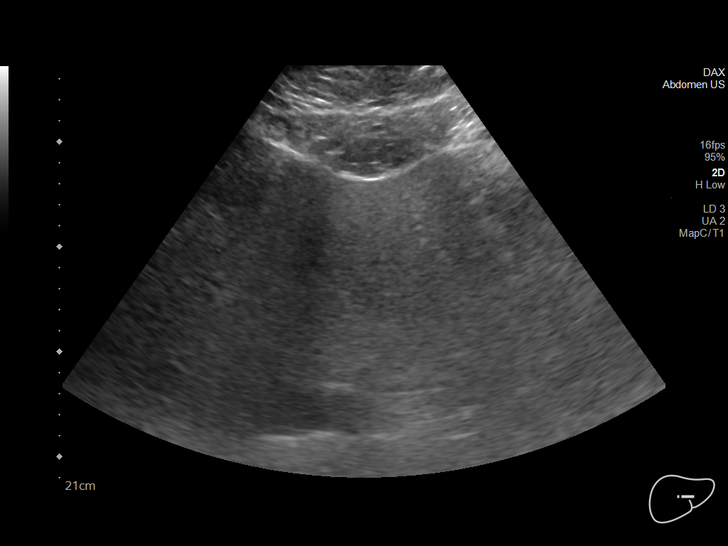
[im 18/33]
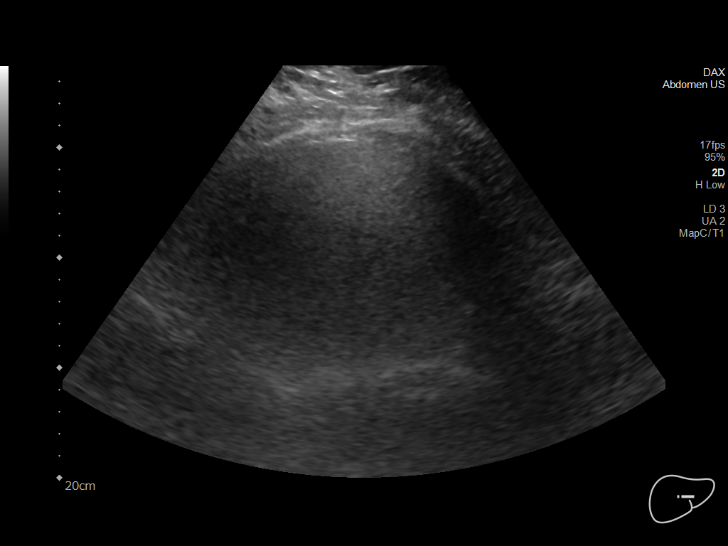
[im 21/33]
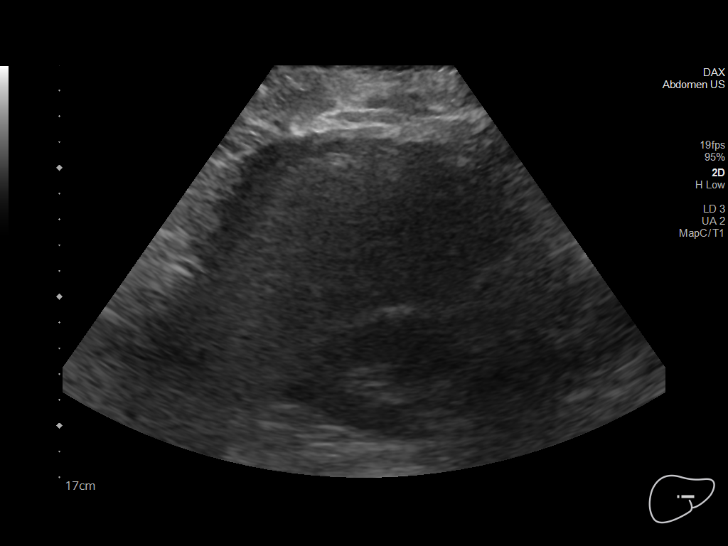
[im 22/33]
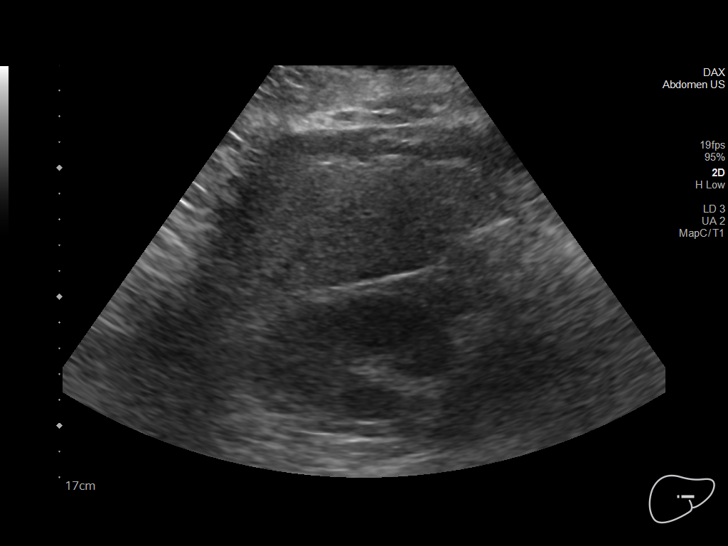
[im 25/33]
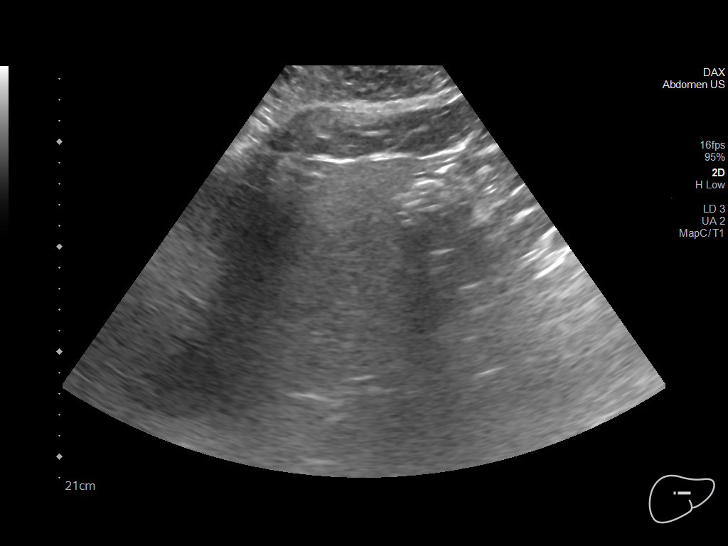
[im 27/33]
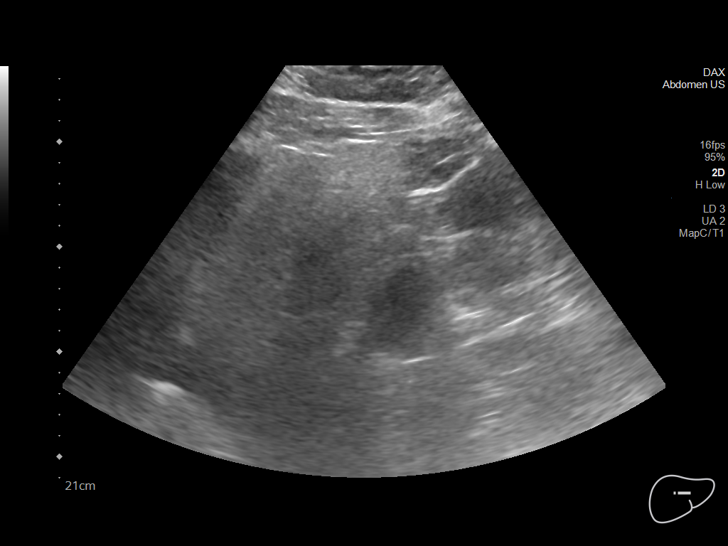
[im 30/33]
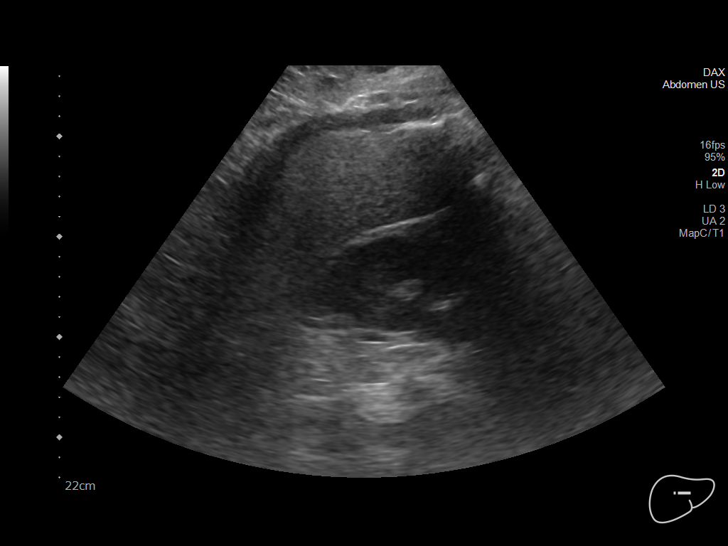
[im 33/33]
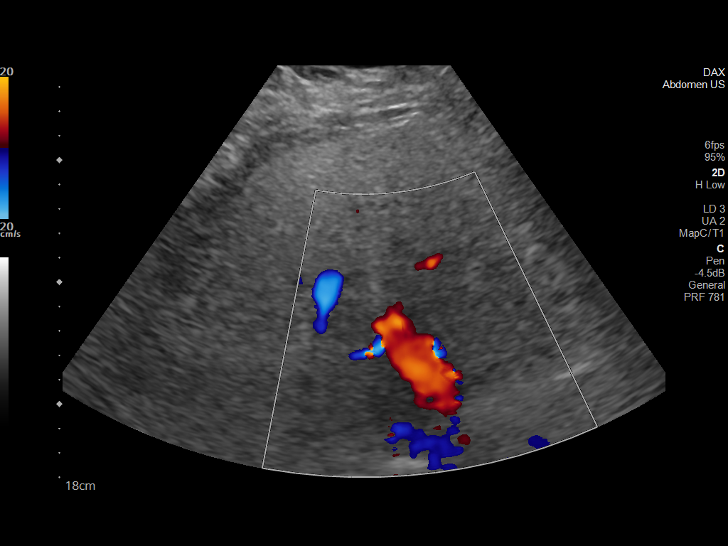

[14 of 25 positions shown; findings below may reference images not displayed]

FINDINGS: Gallbladder:

No gallstones or wall thickening visualized. No sonographic Murphy
sign noted by sonographer.

Common bile duct:

Diameter:

Liver:

Increased echogenicity consistent fatty infiltration or
hepatocellular disease. Portal vein is patent on color Doppler
imaging with normal direction of blood flow towards the liver.

Other: Suboptimal exam due to patient's body habitus.
IMPRESSION: 1. Suboptimal exam due to patient's body habitus. No gallstones or
biliary distention.

2. Increased hepatic echogenicity consistent fatty infiltration or
hepatocellular disease.
